# Patient Record
Sex: Female | Born: 1975 | Race: White | Hispanic: No | Marital: Single | Smoking: Former smoker
Health system: Southern US, Community
[De-identification: ages and names within clinical notes are randomized; demographics above are authoritative.]

## PROBLEM LIST (undated history)

## (undated) DIAGNOSIS — K900 Celiac disease: Secondary | ICD-10-CM

## (undated) DIAGNOSIS — D509 Iron deficiency anemia, unspecified: Secondary | ICD-10-CM

## (undated) DIAGNOSIS — K219 Gastro-esophageal reflux disease without esophagitis: Secondary | ICD-10-CM

## (undated) DIAGNOSIS — O26899 Other specified pregnancy related conditions, unspecified trimester: Secondary | ICD-10-CM

## (undated) DIAGNOSIS — F32A Depression, unspecified: Secondary | ICD-10-CM

## (undated) DIAGNOSIS — F909 Attention-deficit hyperactivity disorder, unspecified type: Secondary | ICD-10-CM

## (undated) DIAGNOSIS — T8859XA Other complications of anesthesia, initial encounter: Secondary | ICD-10-CM

## (undated) DIAGNOSIS — O4700 False labor before 37 completed weeks of gestation, unspecified trimester: Secondary | ICD-10-CM

## (undated) DIAGNOSIS — R51 Headache: Secondary | ICD-10-CM

## (undated) DIAGNOSIS — K259 Gastric ulcer, unspecified as acute or chronic, without hemorrhage or perforation: Secondary | ICD-10-CM

## (undated) DIAGNOSIS — K589 Irritable bowel syndrome without diarrhea: Secondary | ICD-10-CM

## (undated) DIAGNOSIS — T4145XA Adverse effect of unspecified anesthetic, initial encounter: Secondary | ICD-10-CM

## (undated) DIAGNOSIS — F419 Anxiety disorder, unspecified: Secondary | ICD-10-CM

## (undated) DIAGNOSIS — E559 Vitamin D deficiency, unspecified: Secondary | ICD-10-CM

## (undated) DIAGNOSIS — O368190 Decreased fetal movements, unspecified trimester, not applicable or unspecified: Secondary | ICD-10-CM

## (undated) DIAGNOSIS — Z8619 Personal history of other infectious and parasitic diseases: Secondary | ICD-10-CM

## (undated) HISTORY — DX: Vitamin D deficiency, unspecified: E55.9

## (undated) HISTORY — DX: Headache: R51

## (undated) HISTORY — DX: Gastric ulcer, unspecified as acute or chronic, without hemorrhage or perforation: K25.9

## (undated) HISTORY — DX: Celiac disease: K90.0

## (undated) HISTORY — DX: Attention-deficit hyperactivity disorder, unspecified type: F90.9

## (undated) HISTORY — DX: Gastro-esophageal reflux disease without esophagitis: K21.9

## (undated) HISTORY — DX: Other complications of anesthesia, initial encounter: T88.59XA

## (undated) HISTORY — DX: Personal history of other infectious and parasitic diseases: Z86.19

## (undated) HISTORY — DX: Depression, unspecified: F32.A

## (undated) HISTORY — DX: Other specified pregnancy related conditions, unspecified trimester: O26.899

## (undated) HISTORY — DX: False labor before 37 completed weeks of gestation, unspecified trimester: O47.00

## (undated) HISTORY — DX: Anxiety disorder, unspecified: F41.9

## (undated) HISTORY — DX: Decreased fetal movements, unspecified trimester, not applicable or unspecified: O36.8190

## (undated) HISTORY — DX: Iron deficiency anemia, unspecified: D50.9

## (undated) HISTORY — DX: Irritable bowel syndrome, unspecified: K58.9

## (undated) HISTORY — DX: Adverse effect of unspecified anesthetic, initial encounter: T41.45XA

---

## 2004-01-29 ENCOUNTER — Encounter: Admission: RE | Admit: 2004-01-29 | Discharge: 2004-01-29 | Payer: Self-pay | Admitting: Obstetrics and Gynecology

## 2004-12-18 ENCOUNTER — Emergency Department (HOSPITAL_COMMUNITY): Admission: EM | Admit: 2004-12-18 | Discharge: 2004-12-18 | Payer: Self-pay | Admitting: Emergency Medicine

## 2005-08-07 ENCOUNTER — Ambulatory Visit (HOSPITAL_COMMUNITY): Admission: RE | Admit: 2005-08-07 | Discharge: 2005-08-07 | Payer: Self-pay | Admitting: Obstetrics and Gynecology

## 2005-08-07 ENCOUNTER — Encounter (INDEPENDENT_AMBULATORY_CARE_PROVIDER_SITE_OTHER): Payer: Self-pay | Admitting: *Deleted

## 2007-12-11 ENCOUNTER — Inpatient Hospital Stay (HOSPITAL_COMMUNITY): Admission: AD | Admit: 2007-12-11 | Discharge: 2007-12-11 | Payer: Self-pay | Admitting: Obstetrics and Gynecology

## 2007-12-18 ENCOUNTER — Inpatient Hospital Stay (HOSPITAL_COMMUNITY): Admission: AD | Admit: 2007-12-18 | Discharge: 2007-12-21 | Payer: Self-pay | Admitting: Obstetrics and Gynecology

## 2009-06-28 ENCOUNTER — Inpatient Hospital Stay (HOSPITAL_COMMUNITY): Admission: RE | Admit: 2009-06-28 | Discharge: 2009-06-30 | Payer: Self-pay | Admitting: Obstetrics and Gynecology

## 2010-02-17 HISTORY — PX: DILATION AND CURETTAGE OF UTERUS: SHX78

## 2010-03-30 ENCOUNTER — Inpatient Hospital Stay (HOSPITAL_COMMUNITY): Payer: PRIVATE HEALTH INSURANCE

## 2010-03-30 ENCOUNTER — Inpatient Hospital Stay (HOSPITAL_COMMUNITY)
Admission: AD | Admit: 2010-03-30 | Discharge: 2010-03-30 | Disposition: A | Discharge status: Home / Self Care | Payer: PRIVATE HEALTH INSURANCE | Source: Ambulatory Visit | Attending: Obstetrics | Admitting: Obstetrics

## 2010-03-30 DIAGNOSIS — O200 Threatened abortion: Secondary | ICD-10-CM | POA: Insufficient documentation

## 2010-03-30 LAB — HCG, QUANTITATIVE, PREGNANCY: hCG, Beta Chain, Quant, S: 3007 m[IU]/mL — ABNORMAL HIGH (ref <5)

## 2010-03-30 LAB — PROGESTERONE: Progesterone: 20.9 ng/mL

## 2010-04-01 LAB — RH IMMUNE GLOBULIN WORKUP (NOT WOMEN'S HOSP)
ABO/RH(D): A NEG
Antibody Screen: NEGATIVE
Unit division: 0

## 2010-04-15 ENCOUNTER — Other Ambulatory Visit: Payer: Self-pay | Admitting: Obstetrics and Gynecology

## 2010-04-15 ENCOUNTER — Ambulatory Visit (HOSPITAL_COMMUNITY)
Admission: RE | Admit: 2010-04-15 | Discharge: 2010-04-15 | Disposition: A | Discharge status: Home / Self Care | Payer: PRIVATE HEALTH INSURANCE | Source: Ambulatory Visit | Attending: Obstetrics and Gynecology | Admitting: Obstetrics and Gynecology

## 2010-04-15 DIAGNOSIS — O021 Missed abortion: Secondary | ICD-10-CM | POA: Insufficient documentation

## 2010-04-15 LAB — CBC
HCT: 35.9 % — ABNORMAL LOW (ref 36.0–46.0)
Hemoglobin: 12 g/dL (ref 12.0–15.0)
MCH: 32 pg (ref 26.0–34.0)
MCHC: 33.4 g/dL (ref 30.0–36.0)
MCV: 95.7 fL (ref 78.0–100.0)
Platelets: 256 10*3/uL (ref 150–400)
RBC: 3.75 MIL/uL — ABNORMAL LOW (ref 3.87–5.11)
RDW: 12.3 % (ref 11.5–15.5)
WBC: 7.5 10*3/uL (ref 4.0–10.5)

## 2010-04-15 LAB — ABO/RH: ABO/RH(D): A NEG

## 2010-04-18 DEATH — deceased

## 2010-04-20 NOTE — Op Note (Signed)
  NAME:  Michelle Bartlett, Michelle Bartlett               ACCOUNT NO.:  [id number]  MEDICAL RECORD NO.:  [id number]           PATIENT TYPE:  O  LOCATION:  [facility]                          FACILITY:  [facility]  PHYSICIAN:  Lovenia Kim, M.D.DATE OF BIRTH:  06-10-1975  DATE OF PROCEDURE:  04/15/2010 DATE OF DISCHARGE:                              OPERATIVE REPORT   PREOPERATIVE DIAGNOSIS:  An embryonic fetal demise at 19 weeks.  POSTOPERATIVE DIAGNOSIS:  An embryonic fetal demise at 40 weeks.  PROCEDURE:  Suction D and E.  SURGEON:  Lovenia Kim, MD  ASSISTANT:  None.  ANESTHESIA:  MAC and paracervical.  BLOOD LOSS:  Less than 50 mL.  COMPLICATIONS:  None.  DRAINS:  None.  COUNTS:  Correct.  DISPOSITION:  The patient to recovery in good condition.  SPECIMEN:  Products of conception to pathology.  BRIEF OPERATIVE NOTE:  After being apprised of the risks of anesthesia, infection, bleeding, injury to abdominal organs secondary to uterine perforation with possible need for repair, the patient was brought to the operating room where she was administered dosing of her IV sedation without difficulty, prepped and draped in usual sterile fashion, catheterized till the bladder was empty.  Exam under anesthesia revealed a 6-week retroflexed uterus and no adnexal masses were appreciated.  A dilute paracervical block was placed using the 20 mL of the dilute Marcaine solution and the cervix posterior lip was grasped using a single-tooth tenaculum, easily dilated up to a #21 Pratt dilator, 7-mm suction curette placed.  Visualization revealed products of conception, which were inspected upon aspiration.  Good hemostasis was noted.  Blunt curettage in a four-quadrant method revealed the cavity to be empty.  Repeat suction confirmed good hemostasis was noted. All instruments were removed.  The patient tolerated the procedure well, was awakened and transferred to recovery in good condition.  The products of  conception and specimen was sent to pathology for permanent section.     Lovenia Kim, M.D.     RJT/MEDQ  D:  04/15/2010  T:  04/10/2010  Job:  [id number]  Electronically Signed by Brien Few M.D. on 04/20/2010 11:14:29 AM

## 2010-05-07 LAB — CCBB MATERNAL DONOR DRAW

## 2010-05-07 LAB — CBC
HCT: 32.3 % — ABNORMAL LOW (ref 36.0–46.0)
HCT: 32.4 % — ABNORMAL LOW (ref 36.0–46.0)
Hemoglobin: 11.3 g/dL — ABNORMAL LOW (ref 12.0–15.0)
Hemoglobin: 11.3 g/dL — ABNORMAL LOW (ref 12.0–15.0)
MCHC: 34.8 g/dL (ref 30.0–36.0)
MCHC: 35 g/dL (ref 30.0–36.0)
MCV: 100.3 fL — ABNORMAL HIGH (ref 78.0–100.0)
MCV: 99.2 fL (ref 78.0–100.0)
Platelets: 256 10*3/uL (ref 150–400)
Platelets: 269 10*3/uL (ref 150–400)
RBC: 3.22 MIL/uL — ABNORMAL LOW (ref 3.87–5.11)
RBC: 3.27 MIL/uL — ABNORMAL LOW (ref 3.87–5.11)
RDW: 13.2 % (ref 11.5–15.5)
RDW: 13.5 % (ref 11.5–15.5)
WBC: 12.4 10*3/uL — ABNORMAL HIGH (ref 4.0–10.5)
WBC: 15.1 10*3/uL — ABNORMAL HIGH (ref 4.0–10.5)

## 2010-05-07 LAB — RPR: RPR Ser Ql: NONREACTIVE

## 2010-07-02 NOTE — Op Note (Signed)
NAME:  Bartlett, Michelle               ACCOUNT NO.:  [id number]   MEDICAL RECORD NO.:  [id number]          PATIENT TYPE:  INP   LOCATION:  9138                          FACILITY:  [facility]   PHYSICIAN:  Lovenia Kim, M.D.DATE OF BIRTH:  12-Feb-1976   DATE OF PROCEDURE:  DATE OF DISCHARGE:                               OPERATIVE REPORT   INDICATION FOR OPERATIVE DELIVERY:  Maternal exhaustion.   PREOPERATIVE DIAGNOSIS:  Forty weeks maternal exhaustion.   POSTOPERATIVE DIAGNOSIS:  Forty weeks maternal exhaustion.   PROCEDURE:  Outlet vacuum-assisted vaginal delivery with a kiwi cup, 3  pulls and 1 pop off.   SURGEON:  Lovenia Kim, MD   ANESTHESIA:  Epidural.   ESTIMATED BLOOD LOSS:  500 mL.   COMPLICATIONS:  None.   DRAINS:  None.   COUNTS:  Correct.   The patient is recovering in good condition.   BRIEF OPERATIVE NOTE:  After being apprised of risks and benefits of  vacuum-assistance includes small instance of cephalohematoma, scalp  laceration, and intracranial hemorrhage, a kiwi cup was placed.  Fetal  vertex OA, +3 and +4 station for 3 pulls 1 pop off, full-term living  female over central median episiotomy without extension.  Apgars 8 and 9.  Bulb suctioning done.  Placenta delivered spontaneously and intact three-  vessel cord.  Cervix without lacerations and repaired with a 0 Vicryl  and a 3-0 Vicryl Rapide.  Good hemostasis.  Rectum was intact.  Cervix  was intact.  No other perineal lacerations noted.  The patient tolerated  the procedure well and is recovering in good condition.      Lovenia Kim, M.D.  Electronically Signed     RJT/MEDQ  D:  12/19/2007  T:  12/19/2007  Job:  [id number]

## 2010-07-02 NOTE — H&P (Signed)
NAME:  Michelle Bartlett, Michelle Bartlett               ACCOUNT NO.:  [id number]   MEDICAL RECORD NO.:  [id number]          PATIENT TYPE:  INP   LOCATION:  9166                          FACILITY:  [facility]   PHYSICIAN:  Lovenia Kim, M.D.DATE OF BIRTH:  [birth year]   DATE OF ADMISSION:  12/18/2007  DATE OF DISCHARGE:                              HISTORY & PHYSICAL   CHIEF COMPLAINT:  Spontaneous rupture of membranes in early labor.   She is a 35 year old white female G3, P0 at [redacted] weeks gestation who  presents with spontaneous rupture of membranes tonight at 7:35 and  prodromal labor pattern.  Currently in early labor.  She is a nonsmoker,  nondrinker.  She denies domestic or physical violence.   ALLERGIES:  She has allergies to CODEINE.   SOCIAL HISTORY:  She has a history of prior tobacco use.   FAMILY HISTORY:  Hypertension, lung cancer, heart disease, diabetes,  kidney stones, prostate cancer, depression, breast cancer, and stroke.   PREGNANCY HISTORY:  Her previous pregnancy is remarkable for one prior  spontaneous abortion and one induced abortion.   PRENATAL COURSE:  Remarkable for excessive weight gain, but otherwise  normal.   PHYSICAL EXAMINATION:  GENERAL:  She is a well-developed, well-nourished  white female in no acute distress.  HEENT:  Normal.  LUNGS:  Clear.  HEART:  Regular rhythm.  ABDOMEN:  Soft, gravid, nontender.  Estimated fetal weight 7.5-8 pounds.  Cervix is per RN, 2-3, 90% vertex, -1.  EXTREMITIES:  No cords.  NEUROLOGICAL:  Nonfocal.  SKIN:  Intact.   IMPRESSION:  Term intrauterine pregnancy with spontaneous rupture of  membranes.   PLAN:  Admit Pitocin as needed, epidural as needed.  Anticipate attempts  at vaginal delivery.      Lovenia Kim, M.D.  Electronically Signed    RJT/MEDQ  D:  12/18/2007  T:  12/19/2007  Job:  [id number]

## 2010-07-02 NOTE — H&P (Signed)
NAME:  Michelle Bartlett, Michelle Bartlett               ACCOUNT NO.:  [id number]   MEDICAL RECORD NO.:  [id number]          PATIENT TYPE:  MAT   LOCATION:  [facility]                          FACILITY:  [facility]   PHYSICIAN:  Lovenia Kim, M.D.DATE OF BIRTH:  [birth year]   DATE OF ADMISSION:  12/11/2007  DATE OF DISCHARGE:  12/11/2007                              HISTORY & PHYSICAL   CHIEF COMPLAINT:  Labor swelling.   HISTORY OF PRESENT ILLNESS:  She is a 35 year old white female G1, P0,  31 weeks' gestation, who presents with swelling of the face, hands, and  arm, questionable contractions.   ALLERGIES:  She has no known drug allergies.   MEDICATIONS:  Prenatal vitamins.   SOCIAL HISTORY:  She is a nonsmoker and nondrinker.  Denies domestic  physical violence.   PERSONAL HISTORY:  She has a personal history of gastric ulcer,  hemorrhoids, and ADHD.   FAMILY HISTORY:  Hypertension, lung cancer, heart disease, kidney  stones, depression, and breast cancer.   PHYSICAL EXAMINATION:  GENERAL:  She is a well-developed, well-  nourished, white female in no acute distress.  HEENT:  Normal.  LUNGS:  Clear.  HEART:  Regular rate and rhythm.  ABDOMEN:  Soft, gravid, and nontender.  Cervix is closed, 50%, vertex, -  2.  EXTREMITIES:  There are no cords.  DTRs on the extremity exam 2+.  No  evidence of clonus and 1+ pretibial edema noted.  NEUROLOGIC:  Nonfocal.  SKIN:  Intact.   IMPRESSION:  A 39-week OB:  1. No evidence of preeclampsia.  2. No evidence of gestational hypertension.  3. Preterm labor with reactive NST rare contractions noted.   PLAN:  Discharge home.  Follow up in the office as scheduled.     Lovenia Kim, M.D.  Electronically Signed    RJT/MEDQ  D:  12/11/2007  T:  12/12/2007  Job:  [id number]

## 2010-07-05 NOTE — Op Note (Signed)
NAME:  Michelle Bartlett, Michelle Bartlett               ACCOUNT NO.:  [id number]   MEDICAL RECORD NO.:  [id number]          PATIENT TYPE:  AMB   LOCATION:  [facility]                           FACILITY:  [facility]   PHYSICIAN:  Diona Foley, M.D.   DATE OF BIRTH:  [birth year]   DATE OF PROCEDURE:  08/07/2005  DATE OF DISCHARGE:                                 OPERATIVE REPORT   PREOPERATIVE DIAGNOSIS:  First trimester missed abortion.   POSTOPERATIVE DIAGNOSIS:  First trimester missed abortion.   PROCEDURE:  Dilation evacuation of the uterus.   SURGEON:  Diona Foley, M.D.   ANESTHESIA:  Conscious sedation with paracervical block.   ESTIMATED BLOOD LOSS:  Minimal.   COMPLICATIONS:  None.   FINDINGS:  Moderate amount of products of conception sent to pathology.   INDICATIONS:  This is a 35 year old gravida 2, para 0-0-1-0 white female who  had an episode of first trimester bleeding.  She presented for ultrasound  and was found to have a nonviable intrauterine pregnancy with a fetal pole  present but no cardiac activity.  The patient did receive RhoGAM as she is  Rh negative at the time of bleeding.  She presents today for surgical  management with D&E.  Prior to the procedure, the risks, benefits and  alternatives of procedure have been reviewed with the patient in detail.  We  discussed the risks which include but are not limited to hemorrhage  requiring transfusion, infection, injury to the uterus such as uterine  perforation which could cause injury to the bowel or bladder or other intra-  abdominal organs and could require additional surgery through the abdomen.  Also discussed risk of anesthesia and risk of Asherman syndrome which may  affect her fertility in the future.  I reviewed the possibility of retained  products of conception which would require anadditional procedure.  Prior to  the proceed, the patient voiced understanding of all the above risks and  desired to proceed. Informed consent was  obtained before proceeding to the  OR.   PROCEDURE:  The patient was taken to the operating room where she was given  conscious sedation.  She was then prepped and draped in the usual sterile  fashion with Betadine and placed in a dorsolithotomy position.  A red rubber  catheter was used to drain the bladder.  Bimanual exam was performed.  The  uterus was anteverted approximately 7 to 8 weeks size, mobile, with no  obvious masses and no adnexal masses palpated.  A speculum was then placed  into the vagina, and the cervix was grasped with a single-tooth tenaculum.  A paracervical block was administered using a total of 20 mL of 1%  Nesacaine.  After this, the uterus was then very gently dilated with Hegar  dilators.  Uterus sounded to 9 cm.  The #7 suction curette was then  introduced, and suction was applied with removal of a moderate amount of  products of conception.  Once suction was complete, this was followed by  sharp curettage until a gritty texture was noted in all four quadrants.  Suction was  performed one last time to remove any remaining fragments.  Once  the uterus was completely evacuated, the procedure was then terminated.  The  specimen was sent to pathology.  The single-toothed tenaculum was removed.  The tenaculum site was cauterized with silver nitrate.  The  speculum was then removed.  The uterus was examined and was small,  anteverted and mobile.  The patient was then taken out of dorsal lithotomy  position, was reversed from her anesthesia and was taken to recovery room  awake in stable condition.  All sponge, lap, needle and instrument counts  were correct x2.  There were no complications.      Diona Foley, M.D.  Electronically Signed     JW/MEDQ  D:  08/07/2005  T:  08/07/2005  Job:  [id number]

## 2010-11-18 LAB — CBC
HCT: 33.9 — ABNORMAL LOW
Hemoglobin: 11.5 — ABNORMAL LOW
MCHC: 33.8
MCV: 99.8
Platelets: 222
RBC: 3.4 — ABNORMAL LOW
RDW: 13.1
WBC: 11 — ABNORMAL HIGH

## 2010-11-18 LAB — RPR: RPR Ser Ql: NONREACTIVE

## 2010-11-19 LAB — CBC
HCT: 28.7 — ABNORMAL LOW
Hemoglobin: 9.8 — ABNORMAL LOW
MCHC: 34
MCV: 100.8 — ABNORMAL HIGH
Platelets: 205
RBC: 2.84 — ABNORMAL LOW
RDW: 13.5
WBC: 12.9 — ABNORMAL HIGH

## 2011-02-18 NOTE — L&D Delivery Note (Signed)
Delivery Note At 9:43 PM a viable and healthy female was delivered via Vaginal, Spontaneous Delivery (Presentation: ;  ).  APGAR: 7, 8; weight .   Placenta status: Intact, Spontaneous.  Cord: 3 vessels with the following complications: None.  Cord pH: na  Anesthesia: Epidural  Episiotomy: None Lacerations: 2nd degree;Perineal Suture Repair: 2.0 vicryl rapide Est. Blood Loss (mL): 300  Mom to postpartum.  Baby to nursery-stable.  Manuelita Moxon J 02/04/2012, 9:56 PM

## 2011-07-18 LAB — OB RESULTS CONSOLE GC/CHLAMYDIA
Chlamydia: NEGATIVE
Gonorrhea: NEGATIVE

## 2011-07-23 LAB — OB RESULTS CONSOLE RPR: RPR: NONREACTIVE

## 2011-07-23 LAB — OB RESULTS CONSOLE ABO/RH: RH Type: NEGATIVE

## 2011-07-23 LAB — OB RESULTS CONSOLE RUBELLA ANTIBODY, IGM: Rubella: IMMUNE

## 2011-07-23 LAB — OB RESULTS CONSOLE HEPATITIS B SURFACE ANTIGEN: Hepatitis B Surface Ag: NEGATIVE

## 2012-01-05 LAB — OB RESULTS CONSOLE GBS: GBS: NEGATIVE

## 2012-02-02 ENCOUNTER — Encounter (HOSPITAL_COMMUNITY): Payer: Self-pay | Admitting: *Deleted

## 2012-02-02 ENCOUNTER — Telehealth (HOSPITAL_COMMUNITY): Payer: Self-pay | Admitting: *Deleted

## 2012-02-02 NOTE — Telephone Encounter (Signed)
Preadmission screen  

## 2012-02-03 ENCOUNTER — Other Ambulatory Visit: Payer: Self-pay | Admitting: Obstetrics and Gynecology

## 2012-02-04 ENCOUNTER — Encounter (HOSPITAL_COMMUNITY): Payer: Self-pay

## 2012-02-04 ENCOUNTER — Encounter (HOSPITAL_COMMUNITY): Payer: Self-pay | Admitting: Anesthesiology

## 2012-02-04 ENCOUNTER — Inpatient Hospital Stay (HOSPITAL_COMMUNITY)
Admission: RE | Admit: 2012-02-04 | Discharge: 2012-02-06 | DRG: 373 | Disposition: A | Discharge status: Home / Self Care | Payer: BC Managed Care – PPO | Source: Ambulatory Visit | Attending: Obstetrics and Gynecology | Admitting: Obstetrics and Gynecology

## 2012-02-04 ENCOUNTER — Inpatient Hospital Stay (HOSPITAL_COMMUNITY): Payer: BC Managed Care – PPO | Admitting: Anesthesiology

## 2012-02-04 DIAGNOSIS — O368190 Decreased fetal movements, unspecified trimester, not applicable or unspecified: Secondary | ICD-10-CM | POA: Diagnosis present

## 2012-02-04 DIAGNOSIS — O878 Other venous complications in the puerperium: Secondary | ICD-10-CM | POA: Diagnosis present

## 2012-02-04 DIAGNOSIS — O09529 Supervision of elderly multigravida, unspecified trimester: Secondary | ICD-10-CM | POA: Diagnosis present

## 2012-02-04 DIAGNOSIS — D649 Anemia, unspecified: Secondary | ICD-10-CM | POA: Diagnosis not present

## 2012-02-04 DIAGNOSIS — IMO0002 Reserved for concepts with insufficient information to code with codable children: Secondary | ICD-10-CM | POA: Diagnosis present

## 2012-02-04 DIAGNOSIS — O9903 Anemia complicating the puerperium: Secondary | ICD-10-CM | POA: Diagnosis not present

## 2012-02-04 DIAGNOSIS — K649 Unspecified hemorrhoids: Secondary | ICD-10-CM | POA: Diagnosis present

## 2012-02-04 LAB — CBC
Hemoglobin: 10.7 g/dL — ABNORMAL LOW (ref 12.0–15.0)
RBC: 3.23 MIL/uL — ABNORMAL LOW (ref 3.87–5.11)
WBC: 10.6 10*3/uL — ABNORMAL HIGH (ref 4.0–10.5)

## 2012-02-04 LAB — RPR: RPR Ser Ql: NONREACTIVE

## 2012-02-04 MED ORDER — ZOLPIDEM TARTRATE 5 MG PO TABS: 5.0000 mg | ORAL_TABLET | Freq: Every evening | ORAL | Status: DC | PRN

## 2012-02-04 MED ORDER — LACTATED RINGERS IV SOLN
INTRAVENOUS | Status: DC
  Administered 2012-02-04 (×2): 125 mL/h via INTRAVENOUS

## 2012-02-04 MED ORDER — DIPHENHYDRAMINE HCL 25 MG PO CAPS: 25.0000 mg | ORAL_CAPSULE | Freq: Four times a day (QID) | ORAL | Status: DC | PRN

## 2012-02-04 MED ORDER — EPHEDRINE 5 MG/ML INJ: 10.0000 mg | INTRAVENOUS | Status: DC | PRN

## 2012-02-04 MED ORDER — DIBUCAINE 1 % RE OINT
1.0000 "application " | TOPICAL_OINTMENT | RECTAL | Status: DC | PRN
  Administered 2012-02-05 – 2012-02-06 (×2): 1 via RECTAL
  Filled 2012-02-04 (×2): qty 28

## 2012-02-04 MED ORDER — METHYLERGONOVINE MALEATE 0.2 MG/ML IJ SOLN: 0.2000 mg | INTRAMUSCULAR | Status: DC | PRN

## 2012-02-04 MED ORDER — CITRIC ACID-SODIUM CITRATE 334-500 MG/5ML PO SOLN
30.0000 mL | ORAL | Status: DC | PRN
  Administered 2012-02-04: 30 mL via ORAL
  Filled 2012-02-04: qty 15

## 2012-02-04 MED ORDER — ONDANSETRON HCL 4 MG/2ML IJ SOLN: 4.0000 mg | INTRAMUSCULAR | Status: DC | PRN

## 2012-02-04 MED ORDER — LANOLIN HYDROUS EX OINT: TOPICAL_OINTMENT | CUTANEOUS | Status: DC | PRN

## 2012-02-04 MED ORDER — ONDANSETRON HCL 4 MG PO TABS: 4.0000 mg | ORAL_TABLET | ORAL | Status: DC | PRN

## 2012-02-04 MED ORDER — LIDOCAINE HCL (PF) 1 % IJ SOLN
30.0000 mL | INTRAMUSCULAR | Status: DC | PRN
  Filled 2012-02-04: qty 30

## 2012-02-04 MED ORDER — LACTATED RINGERS IV SOLN: 500.0000 mL | INTRAVENOUS | Status: DC | PRN

## 2012-02-04 MED ORDER — TERBUTALINE SULFATE 1 MG/ML IJ SOLN: 0.2500 mg | Freq: Once | INTRAMUSCULAR | Status: DC | PRN

## 2012-02-04 MED ORDER — SENNOSIDES-DOCUSATE SODIUM 8.6-50 MG PO TABS
2.0000 | ORAL_TABLET | Freq: Every day | ORAL | Status: DC
  Administered 2012-02-05 (×2): 2 via ORAL

## 2012-02-04 MED ORDER — DIPHENHYDRAMINE HCL 50 MG/ML IJ SOLN: 12.5000 mg | INTRAMUSCULAR | Status: DC | PRN

## 2012-02-04 MED ORDER — PHENYLEPHRINE 40 MCG/ML (10ML) SYRINGE FOR IV PUSH (FOR BLOOD PRESSURE SUPPORT)
80.0000 ug | PREFILLED_SYRINGE | INTRAVENOUS | Status: DC | PRN
  Filled 2012-02-04: qty 5

## 2012-02-04 MED ORDER — FENTANYL 2.5 MCG/ML BUPIVACAINE 1/10 % EPIDURAL INFUSION (WH - ANES)
INTRAMUSCULAR | Status: DC | PRN
  Administered 2012-02-04: 14 mL/h via EPIDURAL

## 2012-02-04 MED ORDER — OXYTOCIN 40 UNITS IN LACTATED RINGERS INFUSION - SIMPLE MED
1.0000 m[IU]/min | INTRAVENOUS | Status: DC
  Administered 2012-02-04: 2 m[IU]/min via INTRAVENOUS
  Administered 2012-02-04: 12 m[IU]/min via INTRAVENOUS
  Filled 2012-02-04: qty 1000

## 2012-02-04 MED ORDER — PRENATAL MULTIVITAMIN CH
1.0000 | ORAL_TABLET | Freq: Every day | ORAL | Status: DC
  Administered 2012-02-05 – 2012-02-06 (×2): 1 via ORAL
  Filled 2012-02-04 (×2): qty 1

## 2012-02-04 MED ORDER — IBUPROFEN 600 MG PO TABS: 600.0000 mg | ORAL_TABLET | Freq: Four times a day (QID) | ORAL | Status: DC | PRN

## 2012-02-04 MED ORDER — FENTANYL 2.5 MCG/ML BUPIVACAINE 1/10 % EPIDURAL INFUSION (WH - ANES)
14.0000 mL/h | INTRAMUSCULAR | Status: DC
  Administered 2012-02-04: 14 mL/h via EPIDURAL
  Filled 2012-02-04 (×2): qty 125

## 2012-02-04 MED ORDER — OXYCODONE-ACETAMINOPHEN 5-325 MG PO TABS
1.0000 | ORAL_TABLET | ORAL | Status: DC | PRN
  Administered 2012-02-05 (×3): 1 via ORAL
  Administered 2012-02-05 (×2): 2 via ORAL
  Administered 2012-02-05: 1 via ORAL
  Administered 2012-02-05: 2 via ORAL
  Administered 2012-02-06 (×2): 1 via ORAL
  Administered 2012-02-06: 2 via ORAL
  Filled 2012-02-04 (×6): qty 1
  Filled 2012-02-04 (×2): qty 2
  Filled 2012-02-04: qty 1
  Filled 2012-02-04: qty 2
  Filled 2012-02-04: qty 1

## 2012-02-04 MED ORDER — FLEET ENEMA 7-19 GM/118ML RE ENEM: 1.0000 | ENEMA | RECTAL | Status: DC | PRN

## 2012-02-04 MED ORDER — TETANUS-DIPHTH-ACELL PERTUSSIS 5-2.5-18.5 LF-MCG/0.5 IM SUSP
0.5000 mL | Freq: Once | INTRAMUSCULAR | Status: AC
  Administered 2012-02-05: 0.5 mL via INTRAMUSCULAR
  Filled 2012-02-04: qty 0.5

## 2012-02-04 MED ORDER — PHENYLEPHRINE 40 MCG/ML (10ML) SYRINGE FOR IV PUSH (FOR BLOOD PRESSURE SUPPORT): 80.0000 ug | PREFILLED_SYRINGE | INTRAVENOUS | Status: DC | PRN

## 2012-02-04 MED ORDER — LACTATED RINGERS IV SOLN: 500.0000 mL | Freq: Once | INTRAVENOUS | Status: DC

## 2012-02-04 MED ORDER — OXYCODONE-ACETAMINOPHEN 5-325 MG PO TABS: 1.0000 | ORAL_TABLET | ORAL | Status: DC | PRN

## 2012-02-04 MED ORDER — LIDOCAINE HCL (PF) 1 % IJ SOLN
INTRAMUSCULAR | Status: DC | PRN
  Administered 2012-02-04 (×2): 4 mL

## 2012-02-04 MED ORDER — IBUPROFEN 600 MG PO TABS
600.0000 mg | ORAL_TABLET | Freq: Four times a day (QID) | ORAL | Status: DC
  Administered 2012-02-04 – 2012-02-06 (×6): 600 mg via ORAL
  Filled 2012-02-04 (×6): qty 1

## 2012-02-04 MED ORDER — METHYLERGONOVINE MALEATE 0.2 MG PO TABS: 0.2000 mg | ORAL_TABLET | ORAL | Status: DC | PRN

## 2012-02-04 MED ORDER — EPHEDRINE 5 MG/ML INJ
10.0000 mg | INTRAVENOUS | Status: DC | PRN
  Filled 2012-02-04: qty 4

## 2012-02-04 MED ORDER — OXYTOCIN 40 UNITS IN LACTATED RINGERS INFUSION - SIMPLE MED: 62.5000 mL/h | INTRAVENOUS | Status: DC

## 2012-02-04 MED ORDER — ONDANSETRON HCL 4 MG/2ML IJ SOLN
4.0000 mg | Freq: Four times a day (QID) | INTRAMUSCULAR | Status: DC | PRN
  Administered 2012-02-04 (×2): 4 mg via INTRAVENOUS
  Filled 2012-02-04 (×2): qty 2

## 2012-02-04 MED ORDER — BENZOCAINE-MENTHOL 20-0.5 % EX AERO
1.0000 "application " | INHALATION_SPRAY | CUTANEOUS | Status: DC | PRN
  Administered 2012-02-05 – 2012-02-06 (×3): 1 via TOPICAL
  Filled 2012-02-04 (×3): qty 56

## 2012-02-04 MED ORDER — WITCH HAZEL-GLYCERIN EX PADS
1.0000 "application " | MEDICATED_PAD | CUTANEOUS | Status: DC | PRN
  Administered 2012-02-05 – 2012-02-06 (×2): 1 via TOPICAL

## 2012-02-04 MED ORDER — ACETAMINOPHEN 325 MG PO TABS: 650.0000 mg | ORAL_TABLET | ORAL | Status: DC | PRN

## 2012-02-04 MED ORDER — SIMETHICONE 80 MG PO CHEW: 80.0000 mg | CHEWABLE_TABLET | ORAL | Status: DC | PRN

## 2012-02-04 MED ORDER — OXYTOCIN BOLUS FROM INFUSION
500.0000 mL | INTRAVENOUS | Status: DC
  Administered 2012-02-04: 500 mL via INTRAVENOUS

## 2012-02-04 NOTE — Progress Notes (Signed)
Comfortable, remains on pitocin as per orders Dr Ronita Hipps. Request to check on Fetal presentation as had EV this am.  O VSS Filed Vitals:   02/04/12 1101  BP: 120/63  Pulse: 73  Temp:          fhts category 1, baseline 140 - 145 bpm       abd soft between uc      Contractions: 1: 4 mins, mild       Vag: 1 -2 cms external os and internal os 1 cm (funnel), medium, posterior, Vx, Intact.      Bedside US: Presentation, Vx oblique RLQ. A Remains Vx and continues on Pitocin P Contacted Dr. Ronita Hipps for update     Plan: Continue Pitocin to provide contractions and in time, AROM as indicated.             Position: High Fowlers, Lt tilt to aid position of fetus for descent.  Wyatt Mage, CNM.

## 2012-02-04 NOTE — Progress Notes (Signed)
Dr Ronita Hipps in; baby in breech position, Dr turned baby to vertex(pitocin paused during procedure)

## 2012-02-04 NOTE — Progress Notes (Signed)
Michelle Bartlett is a 36 y.o. [id number] at 32w2dby LMP admitted for induction of labor due to dec FM.  Subjective:   Objective: BP 121/71  Pulse 70  Temp 98 F (36.7 C) (Oral)  Ht 5' 9"  (1.753 m)  Wt 87.544 kg (193 lb)  BMI 28.50 kg/m2  LMP 05/05/2011  Breastfeeding? Unknown      FHT:  FHR: 155 bpm, variability: moderate,  accelerations:  Present,  decelerations:  Absent UC:   irregular, every 5 minutes SVE:   Dilation: 3 Effacement (%): 60 Station: -2 Exam by:: Dr TRonita HippsAROM- clear  Labs: Lab Results  Component Value Date   WBC 10.6* 02/04/2012   HGB 10.7* 02/04/2012   HCT 31.1* 02/04/2012   MCV 96.3 02/04/2012   PLT 239 02/04/2012    Assessment / Plan: Induction of labor due to dec FM,  progressing well on pitocin  Labor: Progressing normally Preeclampsia:  na Fetal Wellbeing:  Category I Pain Control:  Labor support without medications I/D:  n/a Anticipated MOD:  NSVD  Michelle Bartlett 02/04/2012, 1:07 PM

## 2012-02-04 NOTE — Progress Notes (Signed)
Michelle Bartlett is a 36 y.o. [id number] at 19w2dby LMP admitted for induction of labor due to dec FM.  Subjective: Comfortable  Objective: BP 114/53  Pulse 66  Temp 97.6 F (36.4 C) (Oral)  Resp 18  Ht 5' 9"  (1.753 m)  Wt 87.544 kg (193 lb)  BMI 28.50 kg/m2  LMP 05/05/2011  Breastfeeding? Unknown      FHT:  FHR: 155 bpm, variability: moderate,  accelerations:  Present,  decelerations:  Absent UC:   irregular, every 3-5 minutes SVE:   4-5/60/-2 IUPC Placed without difficulty  Labs: Lab Results  Component Value Date   WBC 10.6* 02/04/2012   HGB 10.7* 02/04/2012   HCT 31.1* 02/04/2012   MCV 96.3 02/04/2012   PLT 239 02/04/2012    Assessment / Plan: Induction of labor due to dec FM,  progressing well on pitocin  Labor: Progressing normally Preeclampsia:  na Fetal Wellbeing:  Category I Pain Control:  Epidural I/D:  n/a Anticipated MOD:  NSVD  Emiko Osorto J 02/04/2012, 5:44 PM

## 2012-02-04 NOTE — Anesthesia Preprocedure Evaluation (Signed)
Anesthesia Evaluation  Patient identified by MRN, date of birth, ID band Patient awake    Reviewed: Allergy & Precautions, H&P , Patient's Chart, lab work & pertinent test results  Airway Mallampati: III TM Distance: >3 FB Neck ROM: full    Dental No notable dental hx. (+) Teeth Intact   Pulmonary neg pulmonary ROS,  breath sounds clear to auscultation  Pulmonary exam normal       Cardiovascular negative cardio ROS  Rhythm:regular Rate:Normal     Neuro/Psych  Headaches, PSYCHIATRIC DISORDERS ADHDnegative neurological ROS     GI/Hepatic negative GI ROS, Neg liver ROS, PUD, GERD-  Medicated and Controlled,  Endo/Other  negative endocrine ROS  Renal/GU negative Renal ROS  negative genitourinary   Musculoskeletal   Abdominal Normal abdominal exam  (+)   Peds  Hematology negative hematology ROS (+)   Anesthesia Other Findings   Reproductive/Obstetrics (+) Pregnancy                           Anesthesia Physical Anesthesia Plan  ASA: II  Anesthesia Plan: Epidural   Post-op Pain Management:    Induction:   Airway Management Planned: Natural Airway  Additional Equipment:   Intra-op Plan:   Post-operative Plan:   Informed Consent: I have reviewed the patients History and Physical, chart, labs and discussed the procedure including the risks, benefits and alternatives for the proposed anesthesia with the patient or authorized representative who has indicated his/her understanding and acceptance.   Dental advisory given  Plan Discussed with: Anesthesiologist  Anesthesia Plan Comments:         Anesthesia Quick Evaluation

## 2012-02-04 NOTE — Progress Notes (Signed)
Michelle Bartlett is a 36 y.o. [id number] at 63w2dby LMP admitted for induction for dec FM  Subjective: No complaints  Objective: BP 112/56  Pulse 67  Temp 98.2 F (36.8 C) (Axillary)  Ht 5' 9"  (1.753 m)  Wt 87.544 kg (193 lb)  BMI 28.50 kg/m2  LMP 05/05/2011  Breastfeeding? Unknown      FHT:  FHR: 155 bpm, variability: moderate,  accelerations:  Present,  decelerations:  Absent UC:   none SVE:   Dilation: 1 Effacement (%): 50 Station: -3 Exam by:: K.Forsell,RNC  Labs: Lab Results  Component Value Date   WBC 10.6* 02/04/2012   HGB 10.7* 02/04/2012   HCT 31.1* 02/04/2012   MCV 96.3 02/04/2012   PLT 239 02/04/2012    Assessment / Plan: Induction of labor due to dec FM,  progressing well on pitocin  Labor: Progressing on Pitocin, will continue to increase then AROM Preeclampsia:  na Fetal Wellbeing:  Category I Pain Control:  Labor support without medications I/D:  n/a Anticipated MOD:  NSVD  Bray Vickerman J 02/04/2012, 10:52 AM

## 2012-02-04 NOTE — Progress Notes (Signed)
Patient ID: Michelle Bartlett, female   DOB: 1975-06-08, 36 y.o.   MRN: [id number] Presented for induction. History of dec FM and oblique lie. Sono c/w Transverse to breech presentation.  Verbal consent obtained. ECV performed under sono guidance without complications. Pt tolerated procedure well. NsT reactive pre and post ECV.

## 2012-02-04 NOTE — Anesthesia Procedure Notes (Signed)
Epidural Patient location during procedure: OB Start time: 02/04/2012 1:56 PM  Staffing Anesthesiologist: Karrington Studnicka A. Performed by: anesthesiologist   Preanesthetic Checklist Completed: patient identified, site marked, surgical consent, pre-op evaluation, timeout performed, IV checked, risks and benefits discussed and monitors and equipment checked  Epidural Patient position: sitting Prep: site prepped and draped and DuraPrep Patient monitoring: continuous pulse ox and blood pressure Approach: midline Injection technique: LOR air  Needle:  Needle type: Tuohy  Needle gauge: 17 G Needle length: 9 cm and 9 Needle insertion depth: 4 cm Catheter type: closed end flexible Catheter size: 19 Gauge Catheter at skin depth: 9 cm Test dose: negative and Other  Assessment Events: blood not aspirated, injection not painful, no injection resistance, negative IV test and no paresthesia  Additional Notes Patient identified. Risks and benefits discussed including failed block, incomplete  Pain control, post dural puncture headache, nerve damage, paralysis, blood pressure Changes, nausea, vomiting, reactions to medications-both toxic and allergic and post Partum back pain. All questions were answered. Patient expressed understanding and wished to proceed. Sterile technique was used throughout procedure. Epidural site was Dressed with sterile barrier dressing. No paresthesias, signs of intravascular injection Or signs of intrathecal spread were encountered.  Patient was more comfortable after the epidural was dosed. Please see RN's note for documentation of vital signs and FHR which are stable.

## 2012-02-05 ENCOUNTER — Encounter (HOSPITAL_COMMUNITY): Payer: Self-pay

## 2012-02-05 LAB — CBC
Hemoglobin: 11.3 g/dL — ABNORMAL LOW (ref 12.0–15.0)
MCH: 33 pg (ref 26.0–34.0)
MCHC: 33.9 g/dL (ref 30.0–36.0)
MCV: 97.4 fL (ref 78.0–100.0)
RBC: 3.42 MIL/uL — ABNORMAL LOW (ref 3.87–5.11)

## 2012-02-05 MED ORDER — RHO D IMMUNE GLOBULIN 1500 UNIT/2ML IJ SOLN
300.0000 ug | Freq: Once | INTRAMUSCULAR | Status: AC
  Administered 2012-02-05: 300 ug via INTRAMUSCULAR
  Filled 2012-02-05: qty 2

## 2012-02-05 MED ORDER — DOCUSATE SODIUM 100 MG PO CAPS
100.0000 mg | ORAL_CAPSULE | Freq: Two times a day (BID) | ORAL | Status: DC
  Administered 2012-02-05 – 2012-02-06 (×3): 100 mg via ORAL
  Filled 2012-02-05 (×3): qty 1

## 2012-02-05 MED ORDER — HYDROCORTISONE ACE-PRAMOXINE 1-1 % RE FOAM
1.0000 | Freq: Three times a day (TID) | RECTAL | Status: DC | PRN
  Administered 2012-02-05: 1 via RECTAL
  Filled 2012-02-05: qty 10

## 2012-02-05 MED ORDER — CALCIUM CARBONATE ANTACID 500 MG PO CHEW
2.0000 | CHEWABLE_TABLET | Freq: Two times a day (BID) | ORAL | Status: DC | PRN
  Administered 2012-02-05: 400 mg via ORAL
  Filled 2012-02-05: qty 2

## 2012-02-05 MED ORDER — PRAMOXINE HCL 1 % RE FOAM
Freq: Three times a day (TID) | RECTAL | Status: DC | PRN
  Filled 2012-02-05: qty 15

## 2012-02-05 MED ORDER — SCOPOLAMINE 1 MG/3DAYS TD PT72
MEDICATED_PATCH | TRANSDERMAL | Status: AC
  Filled 2012-02-05: qty 1

## 2012-02-05 NOTE — Progress Notes (Signed)
Patient ID: Michelle Bartlett, female   DOB: 1975/07/24, 36 y.o.   MRN: [id number] PPD # 1  Subjective: Pt reports feeling tired and sore/ Pain controlled with ibuprofen and percocet Hx hemorrhoids and C/O significant hemorrhoid pain Tolerating po/ Voiding without problems/ No n/v Bleeding is moderate Newborn info:  Information for the patient's newborn:  [name], [name] [[id number]]  female Feeding: breast   Objective:  VS: Blood pressure 112/77, pulse 74, temperature 98.2 F (36.8 C), temperature source Oral, resp. rate 16    Basename 02/05/12 0535 02/04/12 0745  WBC 13.9* 10.6*  HGB 11.3* 10.7*  HCT 33.3* 31.1*  PLT 237 239    Blood type: --/--/A NEG (12/18 0745) Rubella: Immune (06/05 0000)    Physical Exam:  General: A & O x 3  alert, cooperative and no distress CV: Regular rate and rhythm Resp: clear Abdomen: soft, nontender, normal bowel sounds Uterine Fundus: firm, below umbilicus, nontender Perineum: mild edema; well approximated Lochia: moderate Rectum: 3 small fleshy pink hemorrhoids noted; not thrombosed Ext: edema trace   A/P: PPD # 1/ [id number]/ S/P:spontaneous vaginal delivery with 2nd deg repair Hemorrhoids; meds as ordered Doing well Continue routine post partum orders Anticipate D/C home in AM    Darleen Crocker, MSN, [facility] 02/05/2012, 8:34 AM

## 2012-02-05 NOTE — Anesthesia Postprocedure Evaluation (Signed)
  Anesthesia Post-op Note  Patient: Michelle Bartlett  Procedure(s) Performed: * No procedures listed *   Anesthesia Post-op Note  Patient: Michelle Bartlett  Procedure(s) Performed: * No procedures listed *  Patient Location: PACU and Mother/Baby  Anesthesia Type:Epidural  Level of Consciousness: awake, alert  and oriented  Airway and Oxygen Therapy: Patient Spontanous Breathing  Post-op Pain: none  Post-op Assessment: Post-op Vital signs reviewed, Patient's Cardiovascular Status Stable, No headache, No backache, No residual numbness and No residual motor weakness  Post-op Vital Signs: Reviewed and stable  Complications: No apparent anesthesia complications  Anesthesia Post-op Note  Patient: Michelle Bartlett  Procedure(s) Performed: * No procedures listed *  Patient Location: PACU and Mother/Baby  Anesthesia Type:Epidural  Level of Consciousness: awake, alert  and oriented  Airway and Oxygen Therapy: Patient Spontanous Breathing  Post-op Pain: mild  Post-op Assessment: Post-op Vital signs reviewed, Patient's Cardiovascular Status Stable, No headache, No backache, No residual numbness and No residual motor weakness  Post-op Vital Signs: Reviewed and stable  Complications: No apparent anesthesia complications

## 2012-02-06 LAB — TYPE AND SCREEN
ABO/RH(D): A NEG
Unit division: 0
Unit division: 0

## 2012-02-06 LAB — RH IG WORKUP (INCLUDES ABO/RH)
ABO/RH(D): A NEG
Fetal Screen: NEGATIVE
Gestational Age(Wks): 39.2
Unit division: 0

## 2012-02-06 MED ORDER — IBUPROFEN 600 MG PO TABS: 600.0000 mg | ORAL_TABLET | Freq: Four times a day (QID) | ORAL | Status: DC | PRN

## 2012-02-06 MED ORDER — OXYCODONE-ACETAMINOPHEN 5-325 MG PO TABS: 1.0000 | ORAL_TABLET | ORAL | Status: DC | PRN

## 2012-02-06 MED ORDER — SIMETHICONE 80 MG PO CHEW: 80.0000 mg | CHEWABLE_TABLET | ORAL | Status: DC | PRN

## 2012-02-06 MED ORDER — DSS 100 MG PO CAPS: 100.0000 mg | ORAL_CAPSULE | Freq: Two times a day (BID) | ORAL | Status: DC

## 2012-02-06 MED ORDER — HYDROCORTISONE ACE-PRAMOXINE 1-1 % RE FOAM: 1.0000 | Freq: Three times a day (TID) | RECTAL | Status: DC | PRN

## 2012-02-06 NOTE — Progress Notes (Signed)
Post Partum Day 2 NSVD without complications viable female infant. Subjective: no complaints, up ad lib without syncope, voiding, tolerating PO, + flatus, +BM  Pain well controlled with po meds, taking motrin and percocet  BF: on demand Mood stable, bonding well    Objective: Blood pressure 116/71, pulse 73, temperature 97.3 F (36.3 C), temperature source Oral, resp. rate 18, height 5' 9"  (1.753 m), weight 87.544 kg (193 lb), last menstrual period 05/05/2011, unknown if currently breastfeeding.  Physical Exam:  General: alert, cooperative and no distress Breasts: N/T Lungs: CTAB Heart: RRR Lochia: appropriate Uterine Fundus: firm, -2/u. C/o cramping Perineum: healing well Hemorrhoids: Swollen and painful DVT Evaluation: No evidence of DVT seen on physical exam. Negative Homan's sign. No cords or calf tenderness. No significant calf/ankle edema.   Basename 02/05/12 0535 02/04/12 0745  HGB 11.3* 10.7*  HCT 33.3* 31.1*   Slight anemia post delivery. Advised liquid Iron Supplement Floridix x 2 weeks and Iron rich diet.  Assessment/Plan: Discharge home       LOS: 2 days   Jayde Daffin 02/06/2012, 9:51 AM      LOS: 2 days   Cesar Rogerson 02/06/2012, 9:51 AM

## 2012-02-06 NOTE — Discharge Summary (Signed)
Physician Discharge Summary  Patient ID: Michelle Bartlett MRN: [id number] DOB/AGE: 1975/10/29 36 y.o.  Admit date: 02/04/2012 Discharge date: 02/06/2012  Admission Diagnoses: Admitted for EV for breech Presentation. Post EV Cephalic presentation. IOL at [redacted]w[redacted]d  Discharge Diagnoses:  Principal Problem:  *Postpartum care following vaginal delivery F (02/04/12)   Discharged Condition: stable  Hospital Course: Admitted for EV from Breech to Cephalic Presentation . IOL with Pitocin and AROM. NSVD with 2nd degree perineal laceration. Hemorrhoids swollen pre natally and remains engorged and swollen post delivery.  Consults: None  Significant Diagnostic Studies: labs: prenatal  microbiology: GBS - neg and radiology: Ultrasound: anatomy normal  Treatments: IV hydration and analgesia: acetaminophen w/ codeine and ibuprofen  Discharge Exam: Blood pressure 116/71, pulse 73, temperature 97.3 F (36.3 C), temperature source Oral, resp. rate 18, height 5' 9"  (1.753 m), weight 87.544 kg (193 lb), last menstrual period 05/05/2011, unknown if currently breastfeeding.  ROS:  General appearance: alert, cooperative and no distress Affect: more relaxed today - had been very anxious during labor progress Lungs: CTAB Breasts: N/T CV: RRR Abdomen: Soft, N/T, B/s x 4 with BM GU: no problems voiding GI: normal Perineum: 2nd degree laceration, Hemorrhoids swollen and painful - using Proctofoam HC and Tucks pads. This is a pre existing problem form antenatal phase and will need further attention in postpartum phase for f/u plan. Lochia: Min, Rubra Extremities: no swelling, No edema bilaterally.  Disposition: 01-Home or Self Care  Discharge Orders    Future Orders Please Complete By Expires   Diet general      Discharge instructions      Comments:   Per [facility] Booklet       Medication List     As of 02/06/2012 10:19 AM    STOP taking these medications         acetaminophen 500 MG  tablet   Commonly known as: TYLENOL      TAKE these medications         DSS 100 MG Caps   Take 100 mg by mouth 2 (two) times daily.      hydrocortisone 25 MG suppository   Commonly known as: ANUSOL-HC   Place 25 mg rectally 2 (two) times daily.      hydrocortisone-pramoxine rectal foam   Commonly known as: PROCTOFOAM-HC   Place 1 applicator rectally 3 (three) times daily as needed.      hydrocortisone-pramoxine rectal foam   Commonly known as: PROCTOFOAM-HC   Place 1 applicator rectally 2 (two) times daily.      ibuprofen 600 MG tablet   Commonly known as: ADVIL,MOTRIN   Take 1 tablet (600 mg total) by mouth every 6 (six) hours as needed for pain.      oxyCODONE-acetaminophen 5-325 MG per tablet   Commonly known as: PERCOCET/ROXICET   Take 1-2 tablets by mouth every 4 (four) hours as needed (moderate - severe pain).      prenatal multivitamin Tabs   Take 1 tablet by mouth daily.      simethicone 80 MG chewable tablet   Commonly known as: MYLICON   Chew 1 tablet (80 mg total) by mouth as needed for flatulence.           Follow-up Information    Follow up with [facility]. In 6 days. (As needed)    Contact information:   [phone]        Signed: DWyatt Mage  CNM. 02/06/2012, 10:19 AM

## 2012-02-19 ENCOUNTER — Ambulatory Visit (HOSPITAL_COMMUNITY)
Admission: RE | Admit: 2012-02-19 | Discharge: 2012-02-19 | Disposition: A | Discharge status: Home / Self Care | Payer: BC Managed Care – PPO | Source: Ambulatory Visit | Attending: Obstetrics and Gynecology | Admitting: Obstetrics and Gynecology

## 2012-02-19 NOTE — Progress Notes (Signed)
Adult Lactation Consultation Outpatient Visit Note  Patient Name: Michelle Bartlett                                                                 GD;9-24 Date of Birth: 08-07-1975                        todays weight: 8-8.1 Gestational Age at Delivery: 83w2dType of Delivery: vaginal del,.  Breastfeeding History: Frequency of Breastfeeding: every 2-3 hours Length of Feeding: 15-20 mins each breast Voids: 10-12 Stools: 6 yellow seedy  Supplementing / Method: Pumping:  Type of Pump:Pump n style   Frequency: every 2-3 hours after feedings  Volume:  2-3 ounces  Comments:  Mothers nipples became extremely sore and cracked. Mother has been exclusively pumping since Monday until today. She has breast fed infant several times with painful latch. Infant has had a slow weight gain.  Nipples are cracked and scabbed. Nipple tissue very pink and bleeding after feeding.  Consultation Evaluation:Observed mother latching infant. Lots of teaching with proper technique to prevent soreness. Encouraged mother to use good breast support. Infant latched onto (L) breast  With good deep latch. Mother denies pain with latch. Observed good milk transfer of 46 ml.  Attempt to latch onto (R) nipple and mother unable to bear pain. Used a #24 nipple shield on (R) breast . Infant latched deep with good burst of suckling and swallows for 15 mins. Transferring 30 ml . Total feeding 76 ml. Lots of teaching on proper technique and tips to prevent future sore nipples.   Initial Feeding Assessment: Pre-feed WQASTMH:[id number]-feed WWLNLGX:2119Amount Transferred:462mComments:  Additional Feeding Assessment: Pre-feed WeERDEYC:1448ost-feed WeJEHUDJ:4970mount Transferred:3023momments:  Additional Feeding Assessment: Pre-feed Weight: Post-feed Weight: Amount Transferred: Comments:  Total Breast milk Transferred this Visit: 68m60mtal Supplement Given:   Additional Interventions: Mother  phoned OB to get RX for all purpose nipple cream.  Mother to use good support and wait for wide gape and bring infant quickly to breast.  Mother to rotate positions and unlatch infant properly.  Mother inst to follow up with lactation services for weight check in one week. Mother states she will call for follow up visit.  Follow-Up  PRN    [name], 3:58 PM

## 2012-10-30 ENCOUNTER — Emergency Department (HOSPITAL_COMMUNITY)
Admission: EM | Admit: 2012-10-30 | Discharge: 2012-10-30 | Disposition: A | Discharge status: Home / Self Care | Payer: 59 | Source: Home / Self Care | Attending: Family Medicine | Admitting: Family Medicine

## 2012-10-30 ENCOUNTER — Emergency Department (INDEPENDENT_AMBULATORY_CARE_PROVIDER_SITE_OTHER): Payer: 59

## 2012-10-30 DIAGNOSIS — S93409A Sprain of unspecified ligament of unspecified ankle, initial encounter: Secondary | ICD-10-CM

## 2012-10-30 MED ORDER — TRAMADOL HCL 50 MG PO TABS: 50.0000 mg | ORAL_TABLET | Freq: Four times a day (QID) | ORAL | Status: DC | PRN

## 2012-10-30 NOTE — ED Notes (Signed)
Fell down 2 steps while holding her daughter @ 1700.  States she twisted her R ankle.

## 2012-10-30 NOTE — ED Provider Notes (Signed)
CSN: [id number]     Arrival date & time 10/30/12  1811 History   First MD Initiated Contact with Patient 10/30/12 1836     Chief Complaint  Patient presents with  . Ankle Pain   (Consider location/radiation/quality/duration/timing/severity/associated sxs/prior Treatment) HPI Comments: 37 year old female presents for evaluation of an ankle injury to her right ankle sustained at 5:00 this afternoon. She was taking a step when she fell down 2 steps holding her daughter on her hip. She twisted the right ankle-inversion injury. She had immediate pain and swelling in the lateral right ankle. She has been unable to bear weight on the ankle since this happened. The top of the ankle feels slightly numb as well. There is no other injury. No previous history of ankle sprains or fractures  Patient is a 38 y.o. female presenting with ankle pain.  Ankle Pain Associated symptoms: no fever     Past Medical History  Diagnosis Date  . H/O varicella   . Headache   . Decreased fetal movements, affecting management of mother, unspecified as to episode of care in pregnancy   . Other specified complication of pregnancy, unspecified as to episode of care     back pain  . Threatened premature labor, antepartum   . Attention deficit disorder with hyperactivity   . GERD (gastroesophageal reflux disease)   . Gastric ulcer, unspecified as acute or chronic, without mention of hemorrhage, perforation, or obstruction   . Complication of anesthesia     spinal headache with epidural even in labor with second child  . Postpartum care following vaginal delivery F (02/04/12) 02/05/2012   No past surgical history on file. Family History  Problem Relation Age of Onset  . Cancer Mother     breast  . Urolithiasis Father   . Hypertension Father   . Depression Maternal Grandmother   . Heart attack Maternal Grandfather   . Stroke Maternal Grandfather   . Cancer Maternal Grandfather     prostate  . Diabetes Maternal  Grandfather   . Cancer Paternal Grandmother     breast  . Hypertension Paternal Grandmother   . Heart attack Paternal Grandfather   . Hypertension Paternal Grandfather    History  Substance Use Topics  . Smoking status: Never Smoker   . Smokeless tobacco: Never Used  . Alcohol Use: No   OB History   Grav Para Term Preterm Abortions TAB SAB Ect Mult Living   7 3 3  3 1 2   3      Review of Systems  Constitutional: Negative for fever and chills.  Eyes: Negative for visual disturbance.  Respiratory: Negative for cough and shortness of breath.   Cardiovascular: Negative for chest pain, palpitations and leg swelling.  Gastrointestinal: Negative for nausea, vomiting and abdominal pain.  Endocrine: Negative for polydipsia and polyuria.  Genitourinary: Negative for dysuria, urgency and frequency.  Musculoskeletal: Positive for arthralgias and gait problem. Negative for myalgias.  Skin: Negative for rash.  Neurological: Negative for dizziness, weakness and light-headedness.    Allergies  Codeine  Home Medications   Current Outpatient Rx  Name  Route  Sig  Dispense  Refill  . docusate sodium 100 MG CAPS   Oral   Take 100 mg by mouth 2 (two) times daily.   10 capsule      . hydrocortisone (ANUSOL-HC) 25 MG suppository   Rectal   Place 25 mg rectally 2 (two) times daily.         . hydrocortisone-pramoxine (  PROCTOFOAM-HC) rectal foam   Rectal   Place 1 applicator rectally 2 (two) times daily.         . hydrocortisone-pramoxine (PROCTOFOAM-HC) rectal foam   Rectal   Place 1 applicator rectally 3 (three) times daily as needed.   10 g   2   . ibuprofen (ADVIL,MOTRIN) 600 MG tablet   Oral   Take 1 tablet (600 mg total) by mouth every 6 (six) hours as needed for pain.   30 tablet   2   . oxyCODONE-acetaminophen (PERCOCET/ROXICET) 5-325 MG per tablet   Oral   Take 1-2 tablets by mouth every 4 (four) hours as needed (moderate - severe pain).   30 tablet   0   .  Prenatal Vit-Fe Fumarate-FA (PRENATAL MULTIVITAMIN) TABS   Oral   Take 1 tablet by mouth daily.         . simethicone (MYLICON) 80 MG chewable tablet   Oral   Chew 1 tablet (80 mg total) by mouth as needed for flatulence.   30 tablet   2   . traMADol (ULTRAM) 50 MG tablet   Oral   Take 1 tablet (50 mg total) by mouth every 6 (six) hours as needed for pain.   20 tablet   0    LMP 10/05/2012 Physical Exam  Nursing note and vitals reviewed. Constitutional: She is oriented to person, place, and time. Vital signs are normal. She appears well-developed and well-nourished. No distress.  HENT:  Head: Normocephalic and atraumatic.  Pulmonary/Chest: Effort normal. No respiratory distress.  Musculoskeletal:       Right ankle: She exhibits decreased range of motion and swelling (lateral). She exhibits no ecchymosis and no deformity. Tenderness. AITFL tenderness found. No lateral malleolus, no medial malleolus, no CF ligament, no posterior TFL, no head of 5th metatarsal and no proximal fibula tenderness found. Achilles tendon normal.  Neurological: She is alert and oriented to person, place, and time. She has normal strength. Coordination normal.  Skin: Skin is warm and dry. No rash noted. She is not diaphoretic.  Psychiatric: She has a normal mood and affect. Judgment normal.    ED Course  Procedures (including critical care time) Labs Review Labs Reviewed - No data to display Imaging Review Dg Ankle Complete Right  10/30/2012   CLINICAL DATA:  Inversion injury.  EXAM: RIGHT ANKLE - COMPLETE 3+ VIEW  COMPARISON:  None.  FINDINGS: There is no evidence of fracture, dislocation, or joint effusion. There is no evidence of arthropathy or other focal bone abnormality. Soft tissues are unremarkable.  IMPRESSION: Negative.   Electronically Signed   By: Rolm Baptise M.D.   On: 10/30/2012 18:56   Dg Foot Complete Right  10/30/2012   CLINICAL DATA:  Inversion injury.  EXAM: RIGHT FOOT COMPLETE -  3+ VIEW  COMPARISON:  None.  FINDINGS: There is no evidence of fracture or dislocation. There is no evidence of arthropathy or other focal bone abnormality. Soft tissues are unremarkable.  IMPRESSION: Negative.   Electronically Signed   By: Rolm Baptise M.D.   On: 10/30/2012 18:56    MDM   1. Ankle sprain and strain, right, initial encounter    No radiographic evidence of fracture. The mechanism and exam are consistent with inversion ankle sprain. We'll place in an ASO brace,RICE crutches, follow up with sports medicine.   Meds ordered this encounter  Medications  . traMADol (ULTRAM) 50 MG tablet    Sig: Take 1 tablet (50 mg total) by  mouth every 6 (six) hours as needed for pain.    Dispense:  20 tablet    Refill:  0        Liam Graham, PA-C 10/30/12 1922

## 2012-10-31 NOTE — ED Provider Notes (Signed)
Medical screening examination/treatment/procedure(s) were performed by a resident physician or non-physician practitioner and as the supervising physician I was immediately available for consultation/collaboration.  Lynne Leader, MD    Gregor Hams, MD 10/31/12 [phone]

## 2012-12-26 ENCOUNTER — Ambulatory Visit (INDEPENDENT_AMBULATORY_CARE_PROVIDER_SITE_OTHER): Payer: 59 | Admitting: Physician Assistant

## 2012-12-26 ENCOUNTER — Encounter: Payer: Self-pay | Admitting: Physician Assistant

## 2012-12-26 VITALS — BP 110/76 | HR 64 | Temp 97.6°F | Resp 16 | Ht 67.5 in | Wt 152.0 lb

## 2012-12-26 DIAGNOSIS — R350 Frequency of micturition: Secondary | ICD-10-CM

## 2012-12-26 DIAGNOSIS — R300 Dysuria: Secondary | ICD-10-CM

## 2012-12-26 LAB — POCT UA - MICROSCOPIC ONLY
Casts, Ur, LPF, POC: NEGATIVE
Crystals, Ur, HPF, POC: NEGATIVE
Mucus, UA: NEGATIVE
Yeast, UA: NEGATIVE

## 2012-12-26 LAB — POCT URINALYSIS DIPSTICK
Blood, UA: NEGATIVE
Glucose, UA: NEGATIVE
Nitrite, UA: POSITIVE
Protein, UA: 100
Spec Grav, UA: <=1.005
Urobilinogen, UA: 4
pH, UA: 5

## 2012-12-26 MED ORDER — CIPROFLOXACIN HCL 500 MG PO TABS: 500.0000 mg | ORAL_TABLET | Freq: Two times a day (BID) | ORAL | Status: DC

## 2012-12-26 NOTE — Progress Notes (Signed)
Patient ID: Michelle Bartlett MRN: [id number], DOB: 10/01/1975, 37 y.o. Date of Encounter: 12/26/2012, 11:13 AM  Primary Physician: Lovenia Kim, MD  Chief Complaint: urinary frequency and dysuria  HPI: 37 y.o. year old female with presents with 3 day history of urinary frequency, dysuria, and suprapubic pressure. Complains of some pain radiating to her left flank and some chills today. Denies fever,  vaginal discharge, or odor. Has tried Azo which helps somewhat. LNMP 12/02/12. Husband recently had a vasectomy. She has 3 children at home.  Patient is otherwise doing well without issues or complaints.  Past Medical History  Diagnosis Date  . H/O varicella   . Headache(784.0)   . Decreased fetal movements, affecting management of mother, unspecified as to episode of care in pregnancy   . Other specified complication of pregnancy, unspecified as to episode of care     back pain  . Threatened premature labor, antepartum   . Attention deficit disorder with hyperactivity(314.01)   . GERD (gastroesophageal reflux disease)   . Gastric ulcer, unspecified as acute or chronic, without mention of hemorrhage, perforation, or obstruction   . Complication of anesthesia     spinal headache with epidural even in labor with second child  . Postpartum care following vaginal delivery F (02/04/12) 02/05/2012     Home Meds: Prior to Admission medications   Medication Sig Start Date End Date Taking? Authorizing Provider  acetaminophen (TYLENOL) 325 MG tablet Take 650 mg by mouth every 6 (six) hours as needed.   Yes Historical Provider, MD  phenazopyridine (PYRIDIUM) 95 MG tablet Take 95 mg by mouth 3 (three) times daily as needed for pain.   Yes Historical Provider, MD  ciprofloxacin (CIPRO) 500 MG tablet Take 1 tablet (500 mg total) by mouth 2 (two) times daily. 12/26/12   Collene Leyden, PA-C    Allergies:  Allergies  Allergen Reactions  . Codeine Nausea And Vomiting    History   Social  History  . Marital Status: Married    Spouse Name: N/A    Number of Children: N/A  . Years of Education: N/A   Occupational History  . Not on file.   Social History Main Topics  . Smoking status: Never Smoker   . Smokeless tobacco: Never Used  . Alcohol Use: No  . Drug Use: No  . Sexual Activity: Yes   Other Topics Concern  . Not on file   Social History Narrative  . No narrative on file     Review of Systems: Constitutional: negative for chills, fever, night sweats, weight changes, or fatigue  HEENT: negative for vision changes, hearing loss, congestion, rhinorrhea, ST, epistaxis, or sinus pressure Cardiovascular: negative for chest pain or palpitations Respiratory: negative for cough, hemoptysis, wheezing, shortness of breath. Abdominal: positive for suprapubic abdominal pain,   No nausea, vomiting, diarrhea, or constipation Genitourinary: positive for urinary frequency and dysuria. Negative for vaginal discharge, odor, or pelvic pain.  Dermatological: negative for rashes. Neurologic: negative for headache, dizziness, or syncope   Physical Exam: Blood pressure 110/76, pulse 64, temperature 97.6 F (36.4 C), temperature source Oral, resp. rate 16, height 5' 7.5" (1.715 m), weight 152 lb (68.947 kg), last menstrual period 12/02/2012, SpO2 98.00%, not currently breastfeeding., Body mass index is 23.44 kg/(m^2). General: Well developed, well nourished, in no acute distress. Head: Normocephalic, atraumatic, eyes without discharge, sclera non-icteric, nares are without discharge. External ear normal in appearance. Neck: Supple. No thyromegaly. Full ROM. No lymphadenopathy. Lungs: Clear bilaterally to  auscultation without wheezes, rales, or rhonchi. Breathing is unlabored. Heart: RRR with S1 S2. No murmurs, rubs, or gallops appreciated. Abdominal: +BS x 4. No hepatosplenomegaly, rebound tenderness, or guarding. Positive suprapubic tenderness. No CVA tenderness bilaterally.  Msk:   Strength and tone normal for age. Extremities/Skin: Warm and dry. No clubbing or cyanosis. No edema.  Neuro: Alert and oriented X 3. Moves all extremities spontaneously. Gait is normal. CNII-XII grossly in tact. Psych:  Responds to questions appropriately with a normal affect.   Labs: Results for orders placed in visit on 12/26/12  POCT UA - MICROSCOPIC ONLY      Result Value Range   WBC, Ur, HPF, POC 2-4     RBC, urine, microscopic 0-2     Bacteria, U Microscopic trace     Mucus, UA neg     Epithelial cells, urine per micros 1-3     Crystals, Ur, HPF, POC neg     Casts, Ur, LPF, POC neg     Yeast, UA neg    POCT URINALYSIS DIPSTICK      Result Value Range   Color, UA orange-red     Clarity, UA clear     Glucose, UA neg     Bilirubin, UA small     Ketones, UA a5     Spec Grav, UA <=1.005     Blood, UA neg     pH, UA 5.0     Protein, UA 100     Urobilinogen, UA 4.0     Nitrite, UA pos     Leukocytes, UA large (3+)       ASSESSMENT AND PLAN:  37 y.o. year old female with urinary tract infection Urine culture sent Increase fluids Cipro 500 mg bid x 5 days Azo as needed for symptomatic relief Follow up if symptoms worsen or fail to improve.  Angela Nevin, PA-C 12/26/2012 11:13 AM

## 2012-12-27 LAB — URINE CULTURE: Organism ID, Bacteria: NO GROWTH

## 2013-04-04 ENCOUNTER — Ambulatory Visit (INDEPENDENT_AMBULATORY_CARE_PROVIDER_SITE_OTHER): Payer: 59 | Admitting: General Surgery

## 2013-04-11 ENCOUNTER — Ambulatory Visit (INDEPENDENT_AMBULATORY_CARE_PROVIDER_SITE_OTHER): Payer: 59 | Admitting: General Surgery

## 2013-04-11 ENCOUNTER — Encounter (INDEPENDENT_AMBULATORY_CARE_PROVIDER_SITE_OTHER): Payer: Self-pay | Admitting: General Surgery

## 2013-04-11 VITALS — BP 110/62 | HR 70 | Resp 16 | Ht 69.0 in | Wt 142.0 lb

## 2013-04-11 DIAGNOSIS — K644 Residual hemorrhoidal skin tags: Secondary | ICD-10-CM | POA: Insufficient documentation

## 2013-04-11 NOTE — Patient Instructions (Signed)
Increase the amount of fiber in her diet as we discussed. The goal is 30 grams per day. Each more raw fruits and vegetables.

## 2013-04-11 NOTE — Progress Notes (Signed)
Patient ID: Michelle Bartlett, female   DOB: 23-Jan-1976, 38 y.o.   MRN: [id number]  Chief Complaint  Patient presents with  . Other    Eval ext hems    HPI Michelle Bartlett is a 38 y.o. female.   HPI  She is referred by Dr. Ronita Hipps for evaluation of painful external hemorrhoids.  She has had this problem for many years. I saw her many years ago because of a similar situation. She recently had a thrombosed external hemorrhoid.  She's had a number of incisions and drainages of thrombosed external hemorrhoids. She has periods where she does not have a bowel movement for 7-10 days. Following that she has multiple liquid bowel movements per day. She uses multiple wipes to clean herself. She has significant discomfort with these bowel movements. She also has some bright red bleeding. She has known internal hemorrhoids as well.  Past Medical History  Diagnosis Date  . H/O varicella   . Headache(784.0)   . Decreased fetal movements, affecting management of mother, unspecified as to episode of care in pregnancy   . Other specified complication of pregnancy, unspecified as to episode of care     back pain  . Threatened premature labor, antepartum(644.03)   . Attention deficit disorder with hyperactivity(314.01)   . GERD (gastroesophageal reflux disease)   . Gastric ulcer, unspecified as acute or chronic, without mention of hemorrhage, perforation, or obstruction   . Complication of anesthesia     spinal headache with epidural even in labor with second child  . Postpartum care following vaginal delivery F (02/04/12) 02/05/2012    History reviewed. No pertinent past surgical history.  Family History  Problem Relation Age of Onset  . Cancer Mother     breast  . Urolithiasis Father   . Hypertension Father   . Depression Maternal Grandmother   . Heart attack Maternal Grandfather   . Stroke Maternal Grandfather   . Cancer Maternal Grandfather     prostate  . Diabetes Maternal Grandfather   .  Cancer Paternal Grandmother     breast  . Hypertension Paternal Grandmother   . Heart attack Paternal Grandfather   . Hypertension Paternal Grandfather     Social History History  Substance Use Topics  . Smoking status: Never Smoker   . Smokeless tobacco: Never Used  . Alcohol Use: No    Allergies  Allergen Reactions  . Codeine Nausea And Vomiting    Current Outpatient Prescriptions  Medication Sig Dispense Refill  . acetaminophen (TYLENOL) 325 MG tablet Take 650 mg by mouth every 6 (six) hours as needed.      Marland Kitchen oxyCODONE-acetaminophen (ROXICET) 5-325 MG/5ML solution Take by mouth every 4 (four) hours as needed for severe pain.       No current facility-administered medications for this visit.    Review of Systems Review of Systems  Constitutional: Negative.   Respiratory: Negative.   Cardiovascular: Negative.   Gastrointestinal: Positive for diarrhea, constipation and anal bleeding.    Blood pressure 110/62, pulse 70, resp. rate 16, height 5' 9"  (1.753 m), weight 142 lb (64.411 kg), not currently breastfeeding.  Physical Exam Physical Exam  Constitutional: She appears well-developed and well-nourished. No distress.  HENT:  Head: Normocephalic and atraumatic.  Genitourinary:  Multiple small, non-swollen external hemorrhoids are present. No fissures present. On digital rectal exam shows normal sphincter tone. The digital rectal exam is uncomfortable for her. No masses are palpable. She would not tolerate an anoscope exam.  Data Reviewed Dr. Kennith Maes notes.  Assessment    Painful external hemorrhoids with recurrent thromboses. Her stimulus is the fact that she has severe constipation followed by severe diarrhea. I explained to her that I felt that if we can get her on a regular bowel regimen, her symptoms would decrease dramatically.     Plan    I would not recommend surgery at this time.  Need to get rid of the stimulus by improving her bowel habits. Have  recommended increasing raw fruits and vegetables and fiber in the diet until she's getting 30 g per day. This is to be tried for a month. If this does not help her, have recommend referral to Dr. Collene Mares of gastroenterology.        Michelle Bartlett 04/11/2013, 3:15 PM

## 2013-04-12 ENCOUNTER — Telehealth (INDEPENDENT_AMBULATORY_CARE_PROVIDER_SITE_OTHER): Payer: Self-pay | Admitting: General Surgery

## 2013-04-12 NOTE — Telephone Encounter (Signed)
Pt called to ask if Dr. Zella Richer will give her a Rx for Percocet for the pain of her hemorrhoids.  Dr. Kennith Maes office is closed and he usually will help her with this.  She is using topical Lidocaine and hydrocortisone suppositories, as well as using sitz baths.  She had three very young children and must lift and carry them.  Explained that it is unlikely Dr. Zella Richer will prescribe narcotics at this time, so she will call Dr. Kennith Maes office tomorrow.

## 2013-05-17 ENCOUNTER — Other Ambulatory Visit: Payer: Self-pay | Admitting: Gastroenterology

## 2013-05-17 DIAGNOSIS — R109 Unspecified abdominal pain: Secondary | ICD-10-CM

## 2013-05-24 ENCOUNTER — Ambulatory Visit
Admission: RE | Admit: 2013-05-24 | Discharge: 2013-05-24 | Disposition: A | Discharge status: Home / Self Care | Payer: 59 | Source: Ambulatory Visit | Attending: Gastroenterology | Admitting: Gastroenterology

## 2013-05-24 DIAGNOSIS — R109 Unspecified abdominal pain: Secondary | ICD-10-CM

## 2013-05-25 ENCOUNTER — Other Ambulatory Visit: Payer: Self-pay | Admitting: Gastroenterology

## 2013-05-25 DIAGNOSIS — R109 Unspecified abdominal pain: Secondary | ICD-10-CM

## 2013-05-26 ENCOUNTER — Other Ambulatory Visit: Payer: 59

## 2013-05-31 ENCOUNTER — Other Ambulatory Visit: Payer: 59

## 2013-12-19 ENCOUNTER — Encounter (INDEPENDENT_AMBULATORY_CARE_PROVIDER_SITE_OTHER): Payer: Self-pay | Admitting: General Surgery

## 2015-03-11 IMAGING — CR DG FOOT COMPLETE 3+V*R*
3 series · 3 of 3 positions shown · non-contrast
Comparison: None.

CLINICAL DATA: Inversion injury.

EXAM:
RIGHT FOOT COMPLETE - 3+ VIEW

[view not recorded (1 of 3)]
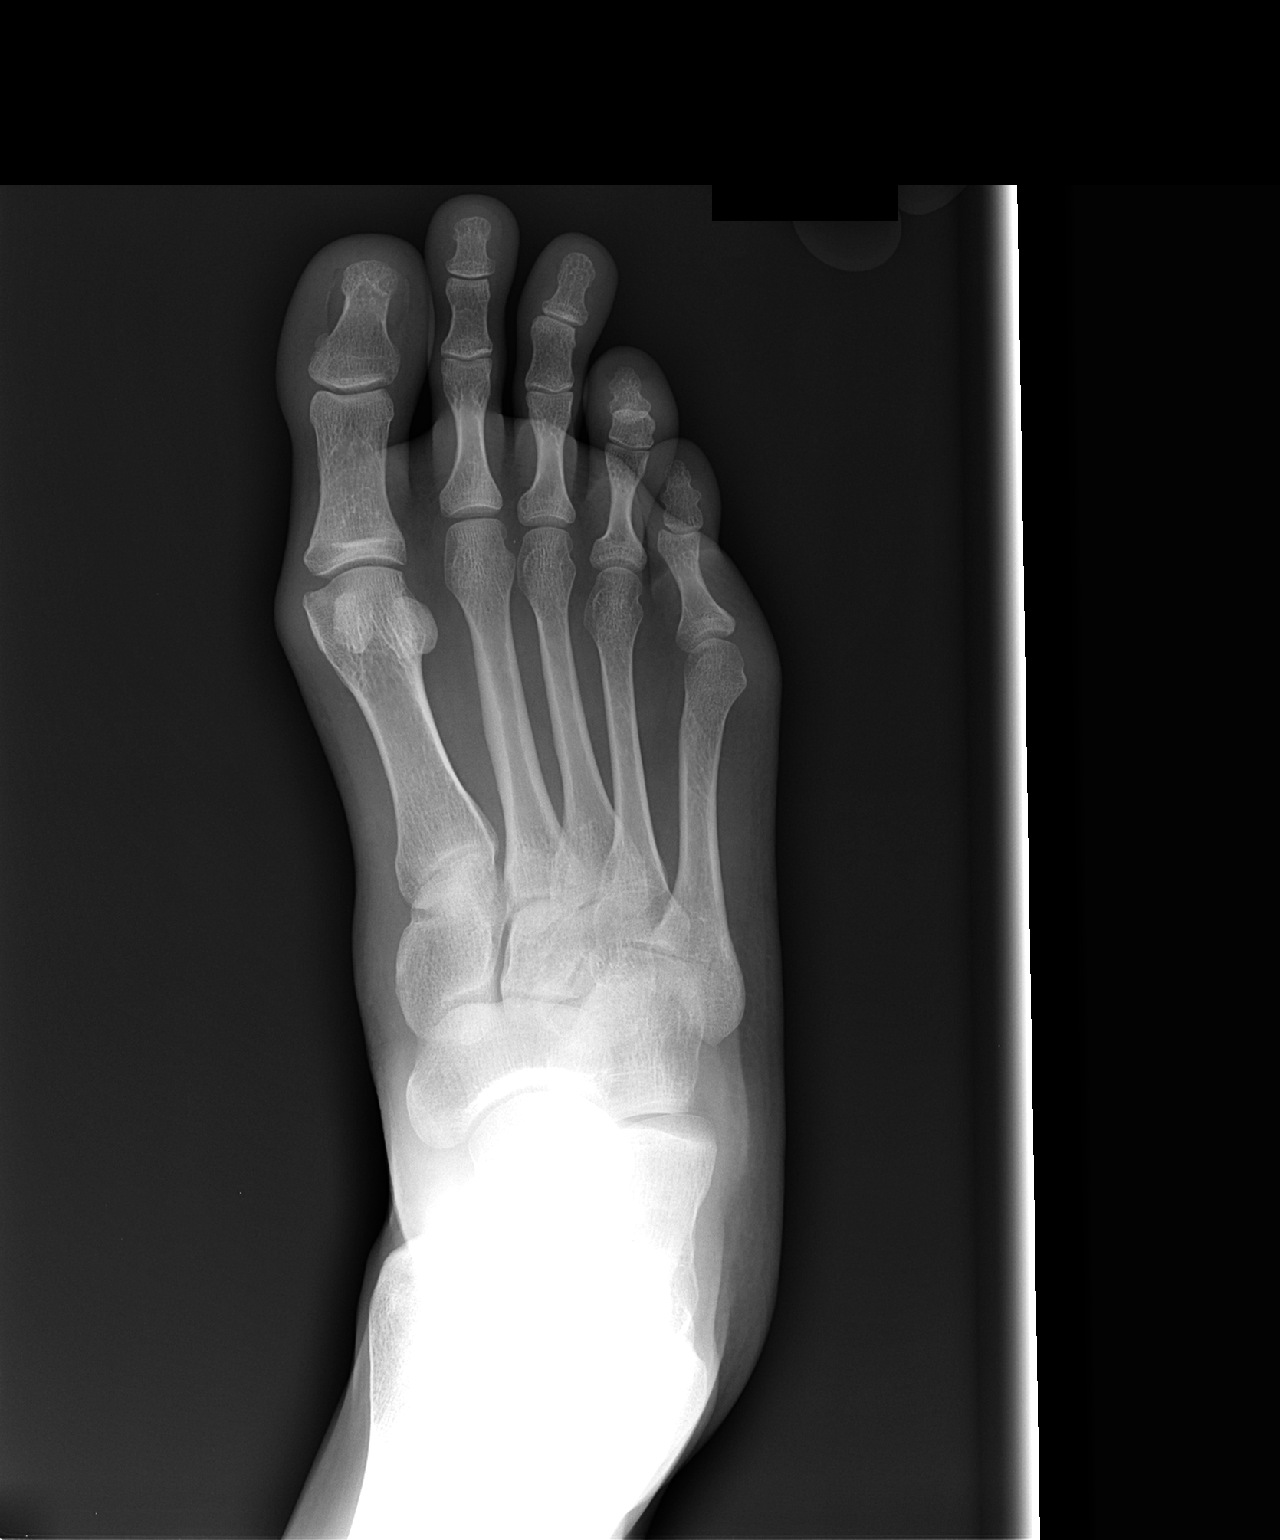

[view not recorded (2 of 3)]
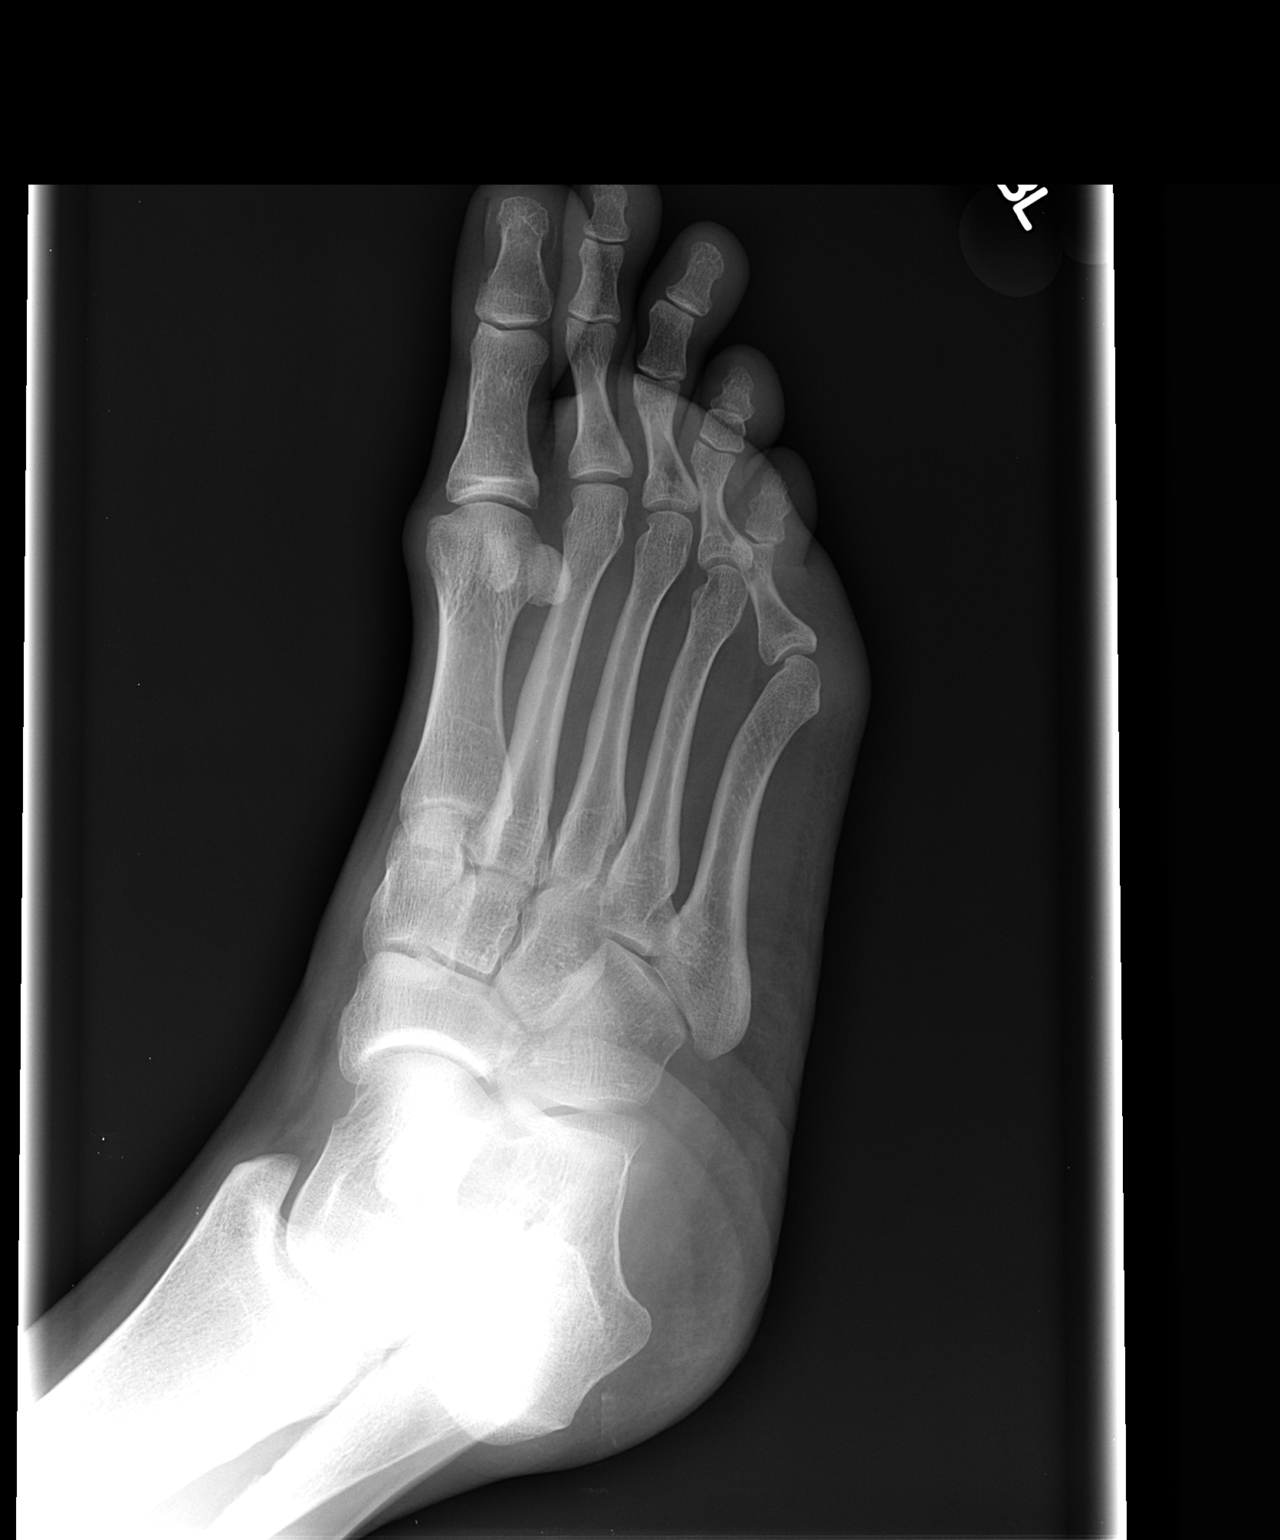

[view not recorded (3 of 3)]
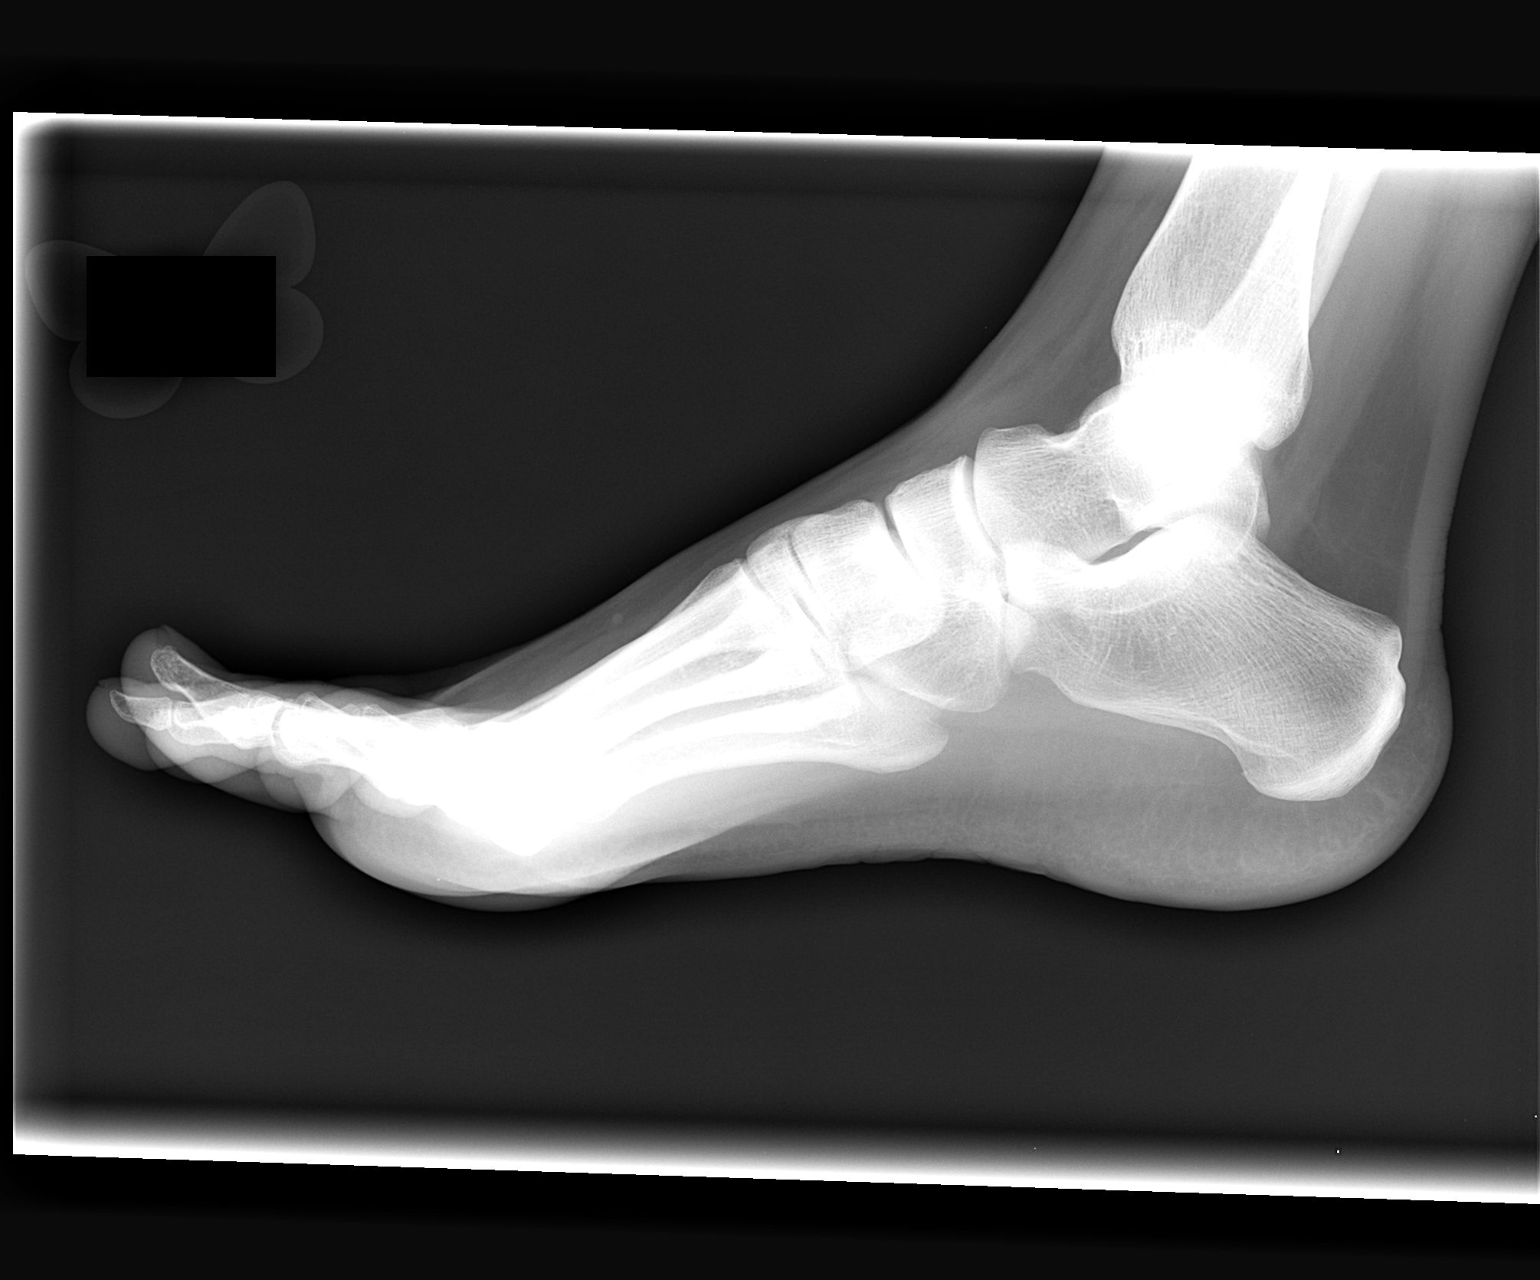

[3 of 3 positions shown; findings below may reference images not displayed]

FINDINGS: There is no evidence of fracture or dislocation. There is no
evidence of arthropathy or other focal bone abnormality. Soft
tissues are unremarkable.
IMPRESSION: Negative.

## 2015-05-25 DIAGNOSIS — F4323 Adjustment disorder with mixed anxiety and depressed mood: Secondary | ICD-10-CM | POA: Diagnosis not present

## 2015-05-31 DIAGNOSIS — F4323 Adjustment disorder with mixed anxiety and depressed mood: Secondary | ICD-10-CM | POA: Diagnosis not present

## 2015-06-06 DIAGNOSIS — F4323 Adjustment disorder with mixed anxiety and depressed mood: Secondary | ICD-10-CM | POA: Diagnosis not present

## 2015-06-08 DIAGNOSIS — F4323 Adjustment disorder with mixed anxiety and depressed mood: Secondary | ICD-10-CM | POA: Diagnosis not present

## 2015-06-08 DIAGNOSIS — N983 Complications of attempted introduction of embryo in embryo transfer: Secondary | ICD-10-CM | POA: Diagnosis not present

## 2015-06-08 DIAGNOSIS — R5383 Other fatigue: Secondary | ICD-10-CM | POA: Diagnosis not present

## 2015-06-27 DIAGNOSIS — Z6822 Body mass index (BMI) 22.0-22.9, adult: Secondary | ICD-10-CM | POA: Diagnosis not present

## 2015-06-27 DIAGNOSIS — D509 Iron deficiency anemia, unspecified: Secondary | ICD-10-CM | POA: Diagnosis not present

## 2015-06-27 DIAGNOSIS — F909 Attention-deficit hyperactivity disorder, unspecified type: Secondary | ICD-10-CM | POA: Diagnosis not present

## 2015-06-27 DIAGNOSIS — F411 Generalized anxiety disorder: Secondary | ICD-10-CM | POA: Diagnosis not present

## 2015-06-29 DIAGNOSIS — F4323 Adjustment disorder with mixed anxiety and depressed mood: Secondary | ICD-10-CM | POA: Diagnosis not present

## 2015-07-04 DIAGNOSIS — F4323 Adjustment disorder with mixed anxiety and depressed mood: Secondary | ICD-10-CM | POA: Diagnosis not present

## 2015-07-11 DIAGNOSIS — F4323 Adjustment disorder with mixed anxiety and depressed mood: Secondary | ICD-10-CM | POA: Diagnosis not present

## 2015-07-20 DIAGNOSIS — F4323 Adjustment disorder with mixed anxiety and depressed mood: Secondary | ICD-10-CM | POA: Diagnosis not present

## 2015-10-03 IMAGING — CR DG SMALL BOWEL
1 series · 1 of 1 positions shown · non-contrast
Comparison: None.

CLINICAL DATA: Abdominal pain, evaluate for inflammatory bowel
disease

EXAM:
SMALL BOWEL SERIES
TECHNIQUE: Following ingestion of thin barium, serial small bowel images were
obtained including spot views of the terminal ileum.
FLUOROSCOPY TIME:  36 seconds

[t abdomen supine]
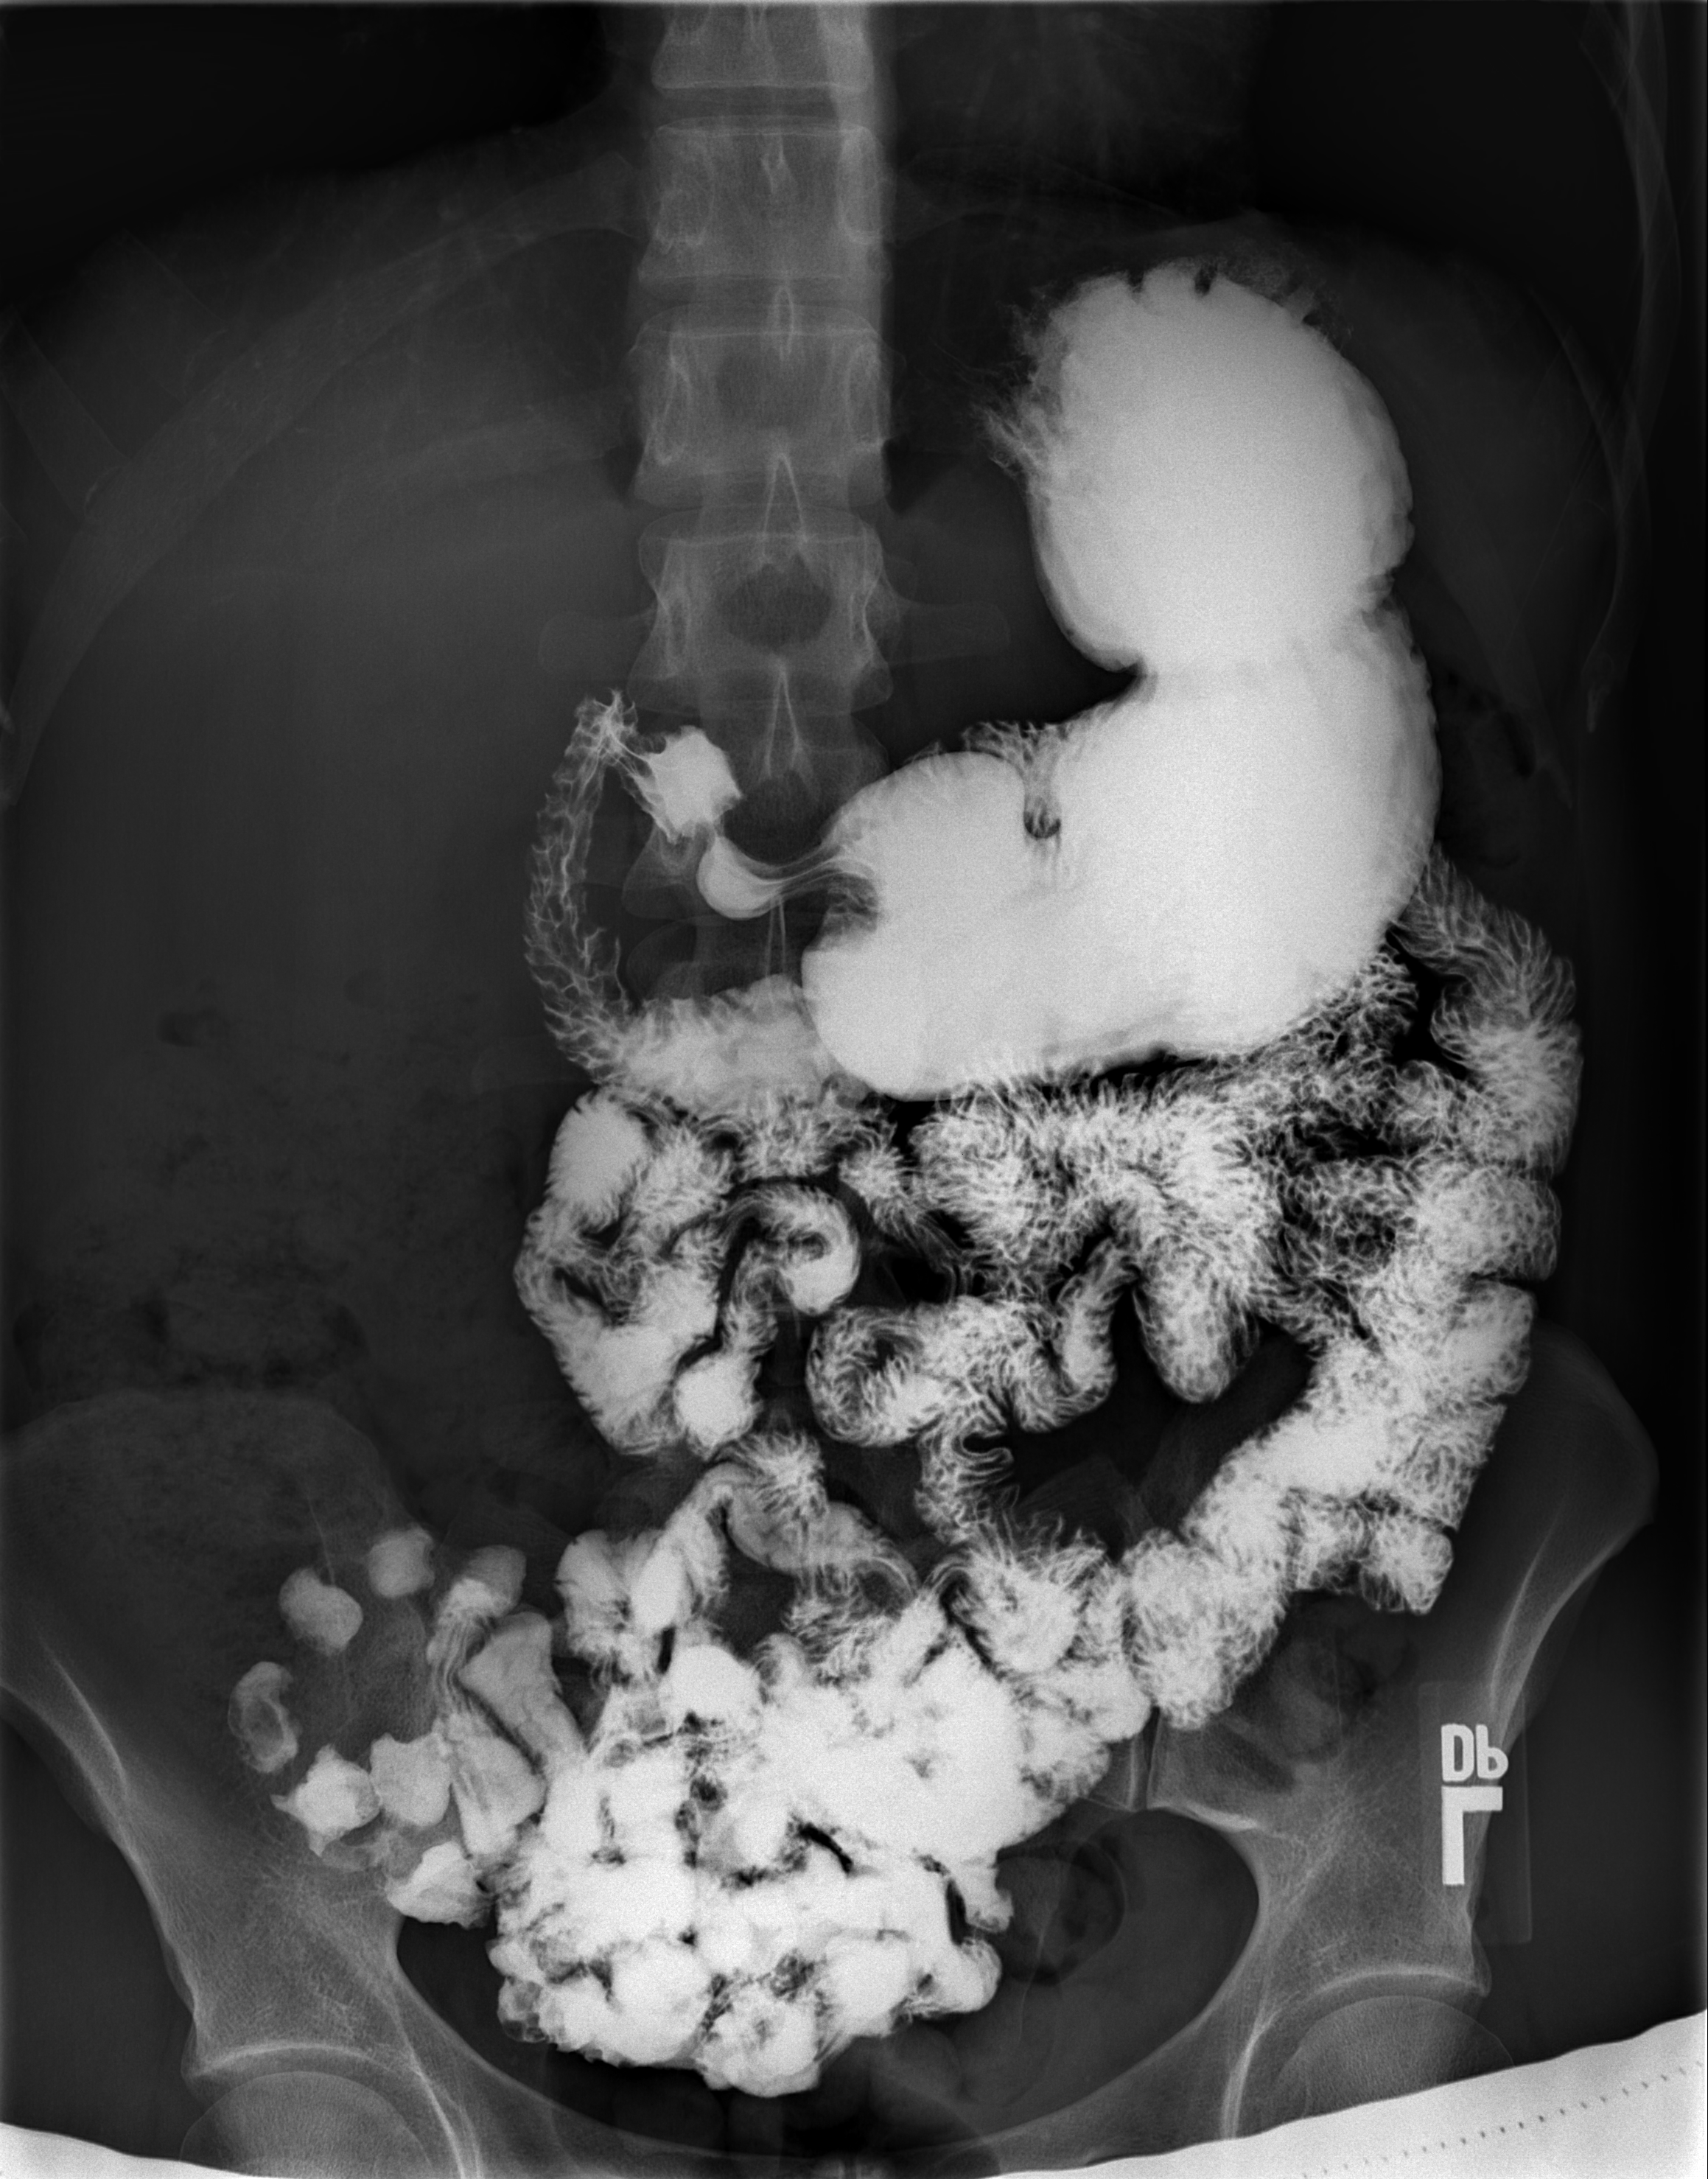

[1 of 1 positions shown; findings below may reference images not displayed]

FINDINGS: A preliminary film of the abdomen shows a moderate amount of feces
throughout the colon. No bowel obstruction is seen. No opaque
calculi are noted.

Barium was given orally and images of the small bowel were obtained.
The mucosal pattern of the small bowel is normal. No edema, mass, or
displacement of small bowel loops is seen. The terminal ileum is
well visualized and appears normal.
IMPRESSION: Negative small bowel follow-through. The terminal ileum appears
normal.

## 2016-09-10 ENCOUNTER — Other Ambulatory Visit: Payer: Self-pay | Admitting: Obstetrics and Gynecology

## 2016-09-10 ENCOUNTER — Encounter (HOSPITAL_COMMUNITY): Payer: Self-pay | Admitting: *Deleted

## 2016-09-11 ENCOUNTER — Ambulatory Visit (HOSPITAL_COMMUNITY): Payer: Self-pay | Admitting: Certified Registered Nurse Anesthetist

## 2016-09-11 ENCOUNTER — Encounter (HOSPITAL_COMMUNITY)
Admission: RE | Disposition: A | Discharge status: Home / Self Care | Payer: Self-pay | Source: Ambulatory Visit | Attending: Obstetrics and Gynecology

## 2016-09-11 ENCOUNTER — Encounter (HOSPITAL_COMMUNITY): Payer: Self-pay

## 2016-09-11 ENCOUNTER — Ambulatory Visit (HOSPITAL_COMMUNITY)
Admission: RE | Admit: 2016-09-11 | Discharge: 2016-09-11 | Disposition: A | Discharge status: Home / Self Care | Payer: Self-pay | Source: Ambulatory Visit | Attending: Obstetrics and Gynecology | Admitting: Obstetrics and Gynecology

## 2016-09-11 DIAGNOSIS — O09521 Supervision of elderly multigravida, first trimester: Secondary | ICD-10-CM | POA: Insufficient documentation

## 2016-09-11 DIAGNOSIS — Z8711 Personal history of peptic ulcer disease: Secondary | ICD-10-CM | POA: Insufficient documentation

## 2016-09-11 DIAGNOSIS — Z3A10 10 weeks gestation of pregnancy: Secondary | ICD-10-CM | POA: Insufficient documentation

## 2016-09-11 DIAGNOSIS — O021 Missed abortion: Secondary | ICD-10-CM | POA: Insufficient documentation

## 2016-09-11 HISTORY — PX: DILATION AND EVACUATION: SHX1459

## 2016-09-11 LAB — CBC
HCT: 34.9 % — ABNORMAL LOW (ref 36.0–46.0)
HEMOGLOBIN: 11.6 g/dL — AB (ref 12.0–15.0)
MCH: 30.9 pg (ref 26.0–34.0)
MCHC: 33.2 g/dL (ref 30.0–36.0)
MCV: 92.8 fL (ref 78.0–100.0)
Platelets: 287 10*3/uL (ref 150–400)
RBC: 3.76 MIL/uL — AB (ref 3.87–5.11)
RDW: 15.1 % (ref 11.5–15.5)
WBC: 7.6 10*3/uL (ref 4.0–10.5)

## 2016-09-11 SURGERY — DILATION AND EVACUATION, UTERUS
Anesthesia: General

## 2016-09-11 MED ORDER — HYDROMORPHONE HCL 1 MG/ML IJ SOLN
0.2500 mg | INTRAMUSCULAR | Status: DC | PRN
  Administered 2016-09-11: 0.25 mg via INTRAVENOUS

## 2016-09-11 MED ORDER — DEXAMETHASONE SODIUM PHOSPHATE 4 MG/ML IJ SOLN
INTRAMUSCULAR | Status: AC
  Filled 2016-09-11: qty 1

## 2016-09-11 MED ORDER — SCOPOLAMINE 1 MG/3DAYS TD PT72
1.0000 | MEDICATED_PATCH | Freq: Once | TRANSDERMAL | Status: DC
  Administered 2016-09-11: 1.5 mg via TRANSDERMAL

## 2016-09-11 MED ORDER — LIDOCAINE HCL (CARDIAC) 20 MG/ML IV SOLN
INTRAVENOUS | Status: DC | PRN
  Administered 2016-09-11: 60 mg via INTRAVENOUS

## 2016-09-11 MED ORDER — TRAMADOL HCL 50 MG PO TABS: 50.0000 mg | ORAL_TABLET | Freq: Four times a day (QID) | ORAL | 0 refills | Status: DC | PRN

## 2016-09-11 MED ORDER — BUPIVACAINE HCL (PF) 0.25 % IJ SOLN
INTRAMUSCULAR | Status: AC
  Filled 2016-09-11: qty 30

## 2016-09-11 MED ORDER — RHO D IMMUNE GLOBULIN 1500 UNIT/2ML IJ SOSY
300.0000 ug | PREFILLED_SYRINGE | Freq: Once | INTRAMUSCULAR | Status: DC
  Filled 2016-09-11: qty 2

## 2016-09-11 MED ORDER — PROMETHAZINE HCL 25 MG/ML IJ SOLN
INTRAMUSCULAR | Status: AC
  Filled 2016-09-11: qty 1

## 2016-09-11 MED ORDER — LACTATED RINGERS IV SOLN
INTRAVENOUS | Status: DC
  Administered 2016-09-11 (×2): via INTRAVENOUS

## 2016-09-11 MED ORDER — KETOROLAC TROMETHAMINE 30 MG/ML IJ SOLN
INTRAMUSCULAR | Status: AC
  Filled 2016-09-11: qty 1

## 2016-09-11 MED ORDER — RHO D IMMUNE GLOBULIN 1500 UNIT/2ML IJ SOSY
300.0000 ug | PREFILLED_SYRINGE | Freq: Once | INTRAMUSCULAR | Status: AC
  Administered 2016-09-11: 300 ug via INTRAMUSCULAR
  Filled 2016-09-11: qty 2

## 2016-09-11 MED ORDER — FENTANYL CITRATE (PF) 100 MCG/2ML IJ SOLN
INTRAMUSCULAR | Status: AC
  Filled 2016-09-11: qty 2

## 2016-09-11 MED ORDER — KETOROLAC TROMETHAMINE 30 MG/ML IJ SOLN
INTRAMUSCULAR | Status: DC | PRN
  Administered 2016-09-11: 30 mg via INTRAVENOUS

## 2016-09-11 MED ORDER — CHLOROPROCAINE HCL 1 % IJ SOLN
INTRAMUSCULAR | Status: AC
  Filled 2016-09-11: qty 30

## 2016-09-11 MED ORDER — CEFAZOLIN SODIUM-DEXTROSE 2-4 GM/100ML-% IV SOLN
2.0000 g | INTRAVENOUS | Status: AC
  Administered 2016-09-11: 2 g via INTRAVENOUS

## 2016-09-11 MED ORDER — SODIUM CHLORIDE 0.9 % IV SOLN
INTRAVENOUS | Status: DC
  Administered 2016-09-11: 500 mL via INTRAVENOUS

## 2016-09-11 MED ORDER — SCOPOLAMINE 1 MG/3DAYS TD PT72
MEDICATED_PATCH | TRANSDERMAL | Status: AC
  Administered 2016-09-11: 1.5 mg via TRANSDERMAL
  Filled 2016-09-11: qty 1

## 2016-09-11 MED ORDER — PROMETHAZINE HCL 25 MG/ML IJ SOLN
6.2500 mg | INTRAMUSCULAR | Status: DC | PRN
  Administered 2016-09-11: 6.25 mg via INTRAVENOUS

## 2016-09-11 MED ORDER — ONDANSETRON HCL 4 MG/2ML IJ SOLN
INTRAMUSCULAR | Status: AC
  Filled 2016-09-11: qty 2

## 2016-09-11 MED ORDER — FENTANYL CITRATE (PF) 100 MCG/2ML IJ SOLN
INTRAMUSCULAR | Status: DC | PRN
  Administered 2016-09-11 (×2): 50 ug via INTRAVENOUS

## 2016-09-11 MED ORDER — HYDROMORPHONE HCL 1 MG/ML IJ SOLN
INTRAMUSCULAR | Status: AC
  Filled 2016-09-11: qty 0.5

## 2016-09-11 MED ORDER — PROPOFOL 10 MG/ML IV BOLUS
INTRAVENOUS | Status: AC
  Filled 2016-09-11: qty 40

## 2016-09-11 MED ORDER — MIDAZOLAM HCL 2 MG/2ML IJ SOLN
INTRAMUSCULAR | Status: DC | PRN
  Administered 2016-09-11: 2 mg via INTRAVENOUS

## 2016-09-11 MED ORDER — MIDAZOLAM HCL 2 MG/2ML IJ SOLN
INTRAMUSCULAR | Status: AC
  Filled 2016-09-11: qty 2

## 2016-09-11 MED ORDER — ONDANSETRON HCL 4 MG/2ML IJ SOLN
INTRAMUSCULAR | Status: DC | PRN
  Administered 2016-09-11: 4 mg via INTRAVENOUS

## 2016-09-11 MED ORDER — PROPOFOL 10 MG/ML IV BOLUS
INTRAVENOUS | Status: DC | PRN
  Administered 2016-09-11: 180 mg via INTRAVENOUS

## 2016-09-11 MED ORDER — BUPIVACAINE HCL (PF) 0.25 % IJ SOLN
INTRAMUSCULAR | Status: DC | PRN
  Administered 2016-09-11: 20 mL

## 2016-09-11 MED ORDER — DEXAMETHASONE SODIUM PHOSPHATE 4 MG/ML IJ SOLN
INTRAMUSCULAR | Status: DC | PRN
Start: 2016-09-11 — End: 2016-09-11
  Administered 2016-09-11: 4 mg via INTRAVENOUS

## 2016-09-11 MED ORDER — LIDOCAINE HCL (CARDIAC) 20 MG/ML IV SOLN
INTRAVENOUS | Status: AC
  Filled 2016-09-11: qty 5

## 2016-09-11 SURGICAL SUPPLY — 18 items
CATH ROBINSON RED A/P 16FR (CATHETERS) ×2 IMPLANT
CLOTH BEACON ORANGE TIMEOUT ST (SAFETY) ×2 IMPLANT
DECANTER SPIKE VIAL GLASS SM (MISCELLANEOUS) ×2 IMPLANT
GLOVE BIO SURGEON STRL SZ7.5 (GLOVE) ×2 IMPLANT
GLOVE BIOGEL PI IND STRL 7.0 (GLOVE) ×1 IMPLANT
GLOVE BIOGEL PI INDICATOR 7.0 (GLOVE) ×1
GOWN STRL REUS W/TWL LRG LVL3 (GOWN DISPOSABLE) ×4 IMPLANT
KIT BERKELEY 1ST TRIMESTER 3/8 (MISCELLANEOUS) ×2 IMPLANT
NS IRRIG 1000ML POUR BTL (IV SOLUTION) ×2 IMPLANT
PACK VAGINAL MINOR WOMEN LF (CUSTOM PROCEDURE TRAY) ×2 IMPLANT
PAD OB MATERNITY 4.3X12.25 (PERSONAL CARE ITEMS) ×2 IMPLANT
PAD PREP 24X48 CUFFED NSTRL (MISCELLANEOUS) ×2 IMPLANT
SET BERKELEY SUCTION TUBING (SUCTIONS) ×2 IMPLANT
TOWEL OR 17X24 6PK STRL BLUE (TOWEL DISPOSABLE) ×4 IMPLANT
VACURETTE 10 RIGID CVD (CANNULA) IMPLANT
VACURETTE 7MM CVD STRL WRAP (CANNULA) IMPLANT
VACURETTE 8 RIGID CVD (CANNULA) IMPLANT
VACURETTE 9 RIGID CVD (CANNULA) ×2 IMPLANT

## 2016-09-11 NOTE — Anesthesia Preprocedure Evaluation (Addendum)
Anesthesia Evaluation  Patient identified by MRN, date of birth, ID band Patient awake    Reviewed: Allergy & Precautions, NPO status , Patient's Chart, lab work & pertinent test results  Airway Mallampati: III  TM Distance: >3 FB Neck ROM: Full    Dental no notable dental hx.    Pulmonary neg pulmonary ROS,    Pulmonary exam normal breath sounds clear to auscultation       Cardiovascular negative cardio ROS Normal cardiovascular exam Rhythm:Regular Rate:Normal     Neuro/Psych negative neurological ROS  negative psych ROS   GI/Hepatic Neg liver ROS, PUD,   Endo/Other  negative endocrine ROS  Renal/GU negative Renal ROS     Musculoskeletal   Abdominal   Peds  Hematology negative hematology ROS (+)   Anesthesia Other Findings   Reproductive/Obstetrics                            Anesthesia Physical Anesthesia Plan  ASA: II  Anesthesia Plan: General   Post-op Pain Management:    Induction: Intravenous  PONV Risk Score and Plan: 2 and Ondansetron, Dexamethasone, Midazolam and Propofol  Airway Management Planned: LMA  Additional Equipment:   Intra-op Plan:   Post-operative Plan: Extubation in OR  Informed Consent: I have reviewed the patients History and Physical, chart, labs and discussed the procedure including the risks, benefits and alternatives for the proposed anesthesia with the patient or authorized representative who has indicated his/her understanding and acceptance.   Dental advisory given  Plan Discussed with: CRNA  Anesthesia Plan Comments:        Anesthesia Quick Evaluation

## 2016-09-11 NOTE — Op Note (Signed)
09/11/2016  1:21 PM  PATIENT:  Michelle Bartlett  41 y.o. female  PRE-OPERATIVE DIAGNOSIS:  Missed Abortion, Twins  POST-OPERATIVE DIAGNOSIS:  Missed Abortion, Twins  PROCEDURE:  Procedure(s): DILATATION AND EVACUATION  SURGEON:  Surgeon(s): Brien Few, MD  ASSISTANTS: none   ANESTHESIA:   local and general  ESTIMATED BLOOD LOSS: 20cc  DRAINS: none   LOCAL MEDICATIONS USED:  MARCAINE    and Amount: 20 ml  SPECIMEN:  Source of Specimen:  poc  DISPOSITION OF SPECIMEN:  Source of Specimen:  poc  COUNTS:  YES  DICTATION #: [id number]  PLAN OF CARE: dc home  PATIENT DISPOSITION:  PACU - hemodynamically stable.

## 2016-09-11 NOTE — Anesthesia Postprocedure Evaluation (Signed)
Anesthesia Post Note  Patient: AMBRIA MAYFIELD  Procedure(s) Performed: Procedure(s) (LRB): DILATATION AND EVACUATION (N/A)     Patient location during evaluation: PACU Anesthesia Type: General Level of consciousness: awake and alert Pain management: pain level controlled Vital Signs Assessment: post-procedure vital signs reviewed and stable Respiratory status: spontaneous breathing, nonlabored ventilation, respiratory function stable and patient connected to nasal cannula oxygen Cardiovascular status: blood pressure returned to baseline and stable Postop Assessment: no signs of nausea or vomiting Anesthetic complications: no    Last Vitals:  Vitals:   09/11/16 1333 09/11/16 1430  BP: 128/90   Pulse: 91   Resp: 13   Temp: 36.5 C 37 C    Last Pain:  Vitals:   09/11/16 1400  TempSrc:   PainSc: Asleep   Pain Goal: Patients Stated Pain Goal: 3 (09/11/16 1345)               Fionnuala Hemmerich P Sunjai Levandoski

## 2016-09-11 NOTE — H&P (Signed)
Michelle Bartlett is an 41 y.o. female. MAB twin gestation.  Pertinent Gynecological History: Menses: flow is moderate Bleeding: dysfunctional uterine bleeding Contraception: none DES exposure: denies Blood transfusions: none Sexually transmitted diseases: no past history Previous GYN Procedures: DNC  Last mammogram: na Date: na Last pap: normal Date: 2018 OB History: G3, P2   Menstrual History: Menarche age: 54 No LMP recorded. Patient is pregnant.    Past Medical History:  Diagnosis Date  . Attention deficit disorder with hyperactivity(314.01)   . Complication of anesthesia    spinal headache with epidural even in labor with second child  . Decreased fetal movements, affecting management of mother, unspecified as to episode of care in pregnancy   . Gastric ulcer, unspecified as acute or chronic, without mention of hemorrhage, perforation, or obstruction   . GERD (gastroesophageal reflux disease)   . H/O varicella   . Headache(784.0)   . Other specified complication of pregnancy, unspecified as to episode of care    back pain  . Postpartum care following vaginal delivery F (02/04/12) 02/05/2012  . Threatened premature labor, antepartum(644.03)     Past Surgical History:  Procedure Laterality Date  . DILATION AND CURETTAGE OF UTERUS  2012    Family History  Problem Relation Age of Onset  . Cancer Mother        breast  . Urolithiasis Father   . Hypertension Father   . Depression Maternal Grandmother   . Heart attack Maternal Grandfather   . Stroke Maternal Grandfather   . Cancer Maternal Grandfather        prostate  . Diabetes Maternal Grandfather   . Cancer Paternal Grandmother        breast  . Hypertension Paternal Grandmother   . Heart attack Paternal Grandfather   . Hypertension Paternal Grandfather     Social History:  reports that she has never smoked. She has never used smokeless tobacco. She reports that she does not drink alcohol or use  drugs.  Allergies:  Allergies  Allergen Reactions  . Codeine Nausea And Vomiting    Prescriptions Prior to Admission  Medication Sig Dispense Refill Last Dose  . acetaminophen (TYLENOL) 325 MG tablet Take 650 mg by mouth every 6 (six) hours as needed.   Past Week at Unknown time    Review of Systems  Constitutional: Negative.   All other systems reviewed and are negative.   Blood pressure (!) 121/51, pulse 72, temperature 98.4 F (36.9 C), temperature source Oral, resp. rate 18, height 5' 8"  (1.727 m), weight 67.6 kg (149 lb), SpO2 99 %. Physical Exam  Nursing note and vitals reviewed. Constitutional: She is oriented to person, place, and time. She appears well-developed and well-nourished.  HENT:  Head: Normocephalic and atraumatic.  Neck: Normal range of motion. Neck supple.  Cardiovascular: Normal rate and regular rhythm.   Respiratory: Effort normal and breath sounds normal.  GI: Soft. Bowel sounds are normal.  Genitourinary: Vagina normal and uterus normal.  Musculoskeletal: Normal range of motion.  Neurological: She is alert and oriented to person, place, and time. She has normal reflexes.  Skin: Skin is warm and dry.  Psychiatric: She has a normal mood and affect.    Results for orders placed or performed during the hospital encounter of 09/11/16 (from the past 24 hour(s))  CBC     Status: Abnormal   Collection Time: 09/11/16 12:15 PM  Result Value Ref Range   WBC 7.6 4.0 - 10.5 K/uL   RBC  3.76 (L) 3.87 - 5.11 MIL/uL   Hemoglobin 11.6 (L) 12.0 - 15.0 g/dL   HCT 34.9 (L) 36.0 - 46.0 %   MCV 92.8 78.0 - 100.0 fL   MCH 30.9 26.0 - 34.0 pg   MCHC 33.2 30.0 - 36.0 g/dL   RDW 15.1 11.5 - 15.5 %   Platelets 287 150 - 400 K/uL    No results found.  Assessment/Plan: MAB twin gestation AMA D&E Risks  Vs benefits of surgery discussed. Declines medical or expectant options.  Consent explained, witnessed and signed.   Dodi Leu J 09/11/2016, 12:32 PM

## 2016-09-11 NOTE — Anesthesia Procedure Notes (Signed)
Procedure Name: LMA Insertion Date/Time: 09/11/2016 1:02 PM Performed by: Raenette Rover Pre-anesthesia Checklist: Patient identified, Emergency Drugs available, Suction available and Patient being monitored Patient Re-evaluated:Patient Re-evaluated prior to induction Oxygen Delivery Method: Circle system utilized Preoxygenation: Pre-oxygenation with 100% oxygen Induction Type: IV induction LMA: LMA with gastric port inserted LMA Size: 4.0 Number of attempts: 1 Placement Confirmation: positive ETCO2,  CO2 detector and breath sounds checked- equal and bilateral Tube secured with: Tape

## 2016-09-11 NOTE — Discharge Instructions (Addendum)
DISCHARGE INSTRUCTIONS: D&C / D&E The following instructions have been prepared to help you care for yourself upon your return home.   Personal hygiene:  Use sanitary pads for vaginal drainage, not tampons.  Shower the day after your procedure.  NO tub baths, pools or Jacuzzis for 2-3 weeks.  Wipe front to back after using the bathroom.  Activity and limitations:  Do NOT drive or operate any equipment for 24 hours. The effects of anesthesia are still present and drowsiness may result.  Do NOT rest in bed all day.  Walking is encouraged.  Walk up and down stairs slowly.  You may resume your normal activity in one to two days or as indicated by your physician.  Sexual activity: NO intercourse for at least 2 weeks after the procedure, or as indicated by your physician.  Diet: Eat a light meal as desired this evening. You may resume your usual diet tomorrow.  Return to work: You may resume your work activities in one to two days or as indicated by your doctor.  What to expect after your surgery: Expect to have vaginal bleeding/discharge for 2-3 days and spotting for up to 10 days. It is not unusual to have soreness for up to 1-2 weeks. You may have a slight burning sensation when you urinate for the first day. Mild cramps may continue for a couple of days. You may have a regular period in 2-6 weeks.  Call your doctor for any of the following:  Excessive vaginal bleeding, saturating and changing one pad every hour.  Inability to urinate 6 hours after discharge from hospital.  Pain not relieved by pain medication.  Fever of 100.4 F or greater.  Unusual vaginal discharge or odor.   Call for an appointment:    Patients signature: ______________________  Nurses signature ________________________  Support person's signature_______________________  Post Anesthesia Home Care Instructions  NO IBUPROFEN PRODUCTS UNTIL: 7:15 PM TONIGHT  Activity: Get plenty of rest for  the remainder of the day. A responsible individual must stay with you for 24 hours following the procedure.  For the next 24 hours, DO NOT: -Drive a car -Paediatric nurse -Drink alcoholic beverages -Take any medication unless instructed by your physician -Make any legal decisions or sign important papers.  Meals: Start with liquid foods such as gelatin or soup. Progress to regular foods as tolerated. Avoid greasy, spicy, heavy foods. If nausea and/or vomiting occur, drink only clear liquids until the nausea and/or vomiting subsides. Call your physician if vomiting continues.  Special Instructions/Symptoms: Your throat may feel dry or sore from the anesthesia or the breathing tube placed in your throat during surgery. If this causes discomfort, gargle with warm salt water. The discomfort should disappear within 24 hours.  If you had a scopolamine patch placed behind your ear for the management of post- operative nausea and/or vomiting:  1. The medication in the patch is effective for 72 hours, after which it should be removed.  Wrap patch in a tissue and discard in the trash. Wash hands thoroughly with soap and water. 2. You may remove the patch earlier than 72 hours if you experience unpleasant side effects which may include dry mouth, dizziness or visual disturbances. 3. Avoid touching the patch. Wash your hands with soap and water after contact with the patch.

## 2016-09-11 NOTE — Transfer of Care (Signed)
Immediate Anesthesia Transfer of Care Note  Patient: Michelle Bartlett  Procedure(s) Performed: Procedure(s): DILATATION AND EVACUATION (N/A)  Patient Location: PACU  Anesthesia Type:General  Level of Consciousness: awake, alert  and oriented  Airway & Oxygen Therapy: Patient Spontanous Breathing  Post-op Assessment: Report given to RN and Post -op Vital signs reviewed and stable  Post vital signs: Reviewed and stable  Last Vitals:  Vitals:   09/11/16 1222  BP: (!) 121/51  Pulse: 72  Resp: 18  Temp: 36.9 C    Last Pain:  Vitals:   09/11/16 1222  TempSrc: Oral      Patients Stated Pain Goal: 3 (97/58/83 2549)  Complications: No apparent anesthesia complications

## 2016-09-11 NOTE — Op Note (Signed)
NAME:  Michelle Bartlett,                       ACCOUNT NO.:  [id number]  MEDICAL RECORD NO.:  [id number]  LOCATION:                                 FACILITY:  PHYSICIAN:  Lovenia Kim, M.D.     DATE OF BIRTH:  DATE OF PROCEDURE: DATE OF DISCHARGE:                              OPERATIVE REPORT   PREOPERATIVE DIAGNOSIS:  Missed abortion, twins, 10 weeks.  POSTOPERATIVE DIAGNOSIS:  Missed abortion, twins, 10 weeks.  PROCEDURE:  Suction D and E.  SURGEON:  Lovenia Kim, M.D.  ASSISTANT:  None.  ANESTHESIA:  General and local.  ESTIMATED BLOOD LOSS:  20 mL.  COMPLICATIONS:  None.  DRAINS:  None.  COUNTS:  Correct.  DISPOSITION:  The patient to Recovery in good condition.  PATHOLOGY:  Products of conception to Pathology.  BRIEF OPERATIVE NOTE:  After being apprised of risks of anesthesia, infection, bleeding, injury to surrounding organs, possible need for repair, delayed versus immediate complications to include bowel and bladder injury, possible need for repair, the patient was brought to the operating room where she was administered general anesthetic without complications.  Prepped and draped in usual sterile fashion.  Feet were placed in [place].  Trendelenburg position established.  Exam under anesthesia revealed a bulky mid position, 10-week size uterus without adnexal masses appreciated.  Cervix was grasped using a single- tooth tenaculum.  Dilute paracervical block with 0.25% Nesacaine placed in a standard fashion, 20 mL total.  Cervix easily dilated up to a #27 Pratt dilator.  A 9 mm suction curette placed.  Aspiration of products of conception was done, aspirated without difficulty.  Repeat suction and blunt curettage in a 4-quadrant method reveals the cavity to be empty.  Good hemostasis noted.  All instruments were removed.  The patient tolerated the procedure well, was awakened, and transferred to recovery in good condition.     Lovenia Kim, M.D.     RJT/MEDQ  D:  09/11/2016  T:  09/11/2016  Job:  [id number]  cc:   Lovenia Kim, M.D. Fax: [phone]

## 2016-09-11 NOTE — Progress Notes (Signed)
Patient seen and examined. Consent witnessed and signed. No changes noted. Update completed.Patient ID: Michelle Bartlett, female   DOB: Jul 16, 1975, 41 y.o.   MRN: [id number]

## 2016-09-12 ENCOUNTER — Encounter (HOSPITAL_COMMUNITY): Payer: Self-pay | Admitting: Obstetrics and Gynecology

## 2016-09-12 LAB — RH IG WORKUP (INCLUDES ABO/RH)
ABO/RH(D): A NEG
Antibody Screen: POSITIVE
DAT, IGG: NEGATIVE
GESTATIONAL AGE(WKS): 10
UNIT DIVISION: 0

## 2017-02-17 HISTORY — PX: BREAST ENHANCEMENT SURGERY: SHX7

## 2017-03-17 DIAGNOSIS — Z6821 Body mass index (BMI) 21.0-21.9, adult: Secondary | ICD-10-CM | POA: Diagnosis not present

## 2017-03-17 DIAGNOSIS — F909 Attention-deficit hyperactivity disorder, unspecified type: Secondary | ICD-10-CM | POA: Diagnosis not present

## 2017-04-06 DIAGNOSIS — F411 Generalized anxiety disorder: Secondary | ICD-10-CM | POA: Diagnosis not present

## 2017-04-13 DIAGNOSIS — F411 Generalized anxiety disorder: Secondary | ICD-10-CM | POA: Diagnosis not present

## 2017-04-23 DIAGNOSIS — F411 Generalized anxiety disorder: Secondary | ICD-10-CM | POA: Diagnosis not present

## 2017-06-09 DIAGNOSIS — F909 Attention-deficit hyperactivity disorder, unspecified type: Secondary | ICD-10-CM | POA: Diagnosis not present

## 2017-06-09 DIAGNOSIS — Z6820 Body mass index (BMI) 20.0-20.9, adult: Secondary | ICD-10-CM | POA: Diagnosis not present

## 2017-07-21 DIAGNOSIS — R300 Dysuria: Secondary | ICD-10-CM | POA: Diagnosis not present

## 2017-08-01 DIAGNOSIS — N3001 Acute cystitis with hematuria: Secondary | ICD-10-CM | POA: Diagnosis not present

## 2017-08-10 DIAGNOSIS — F331 Major depressive disorder, recurrent, moderate: Secondary | ICD-10-CM | POA: Diagnosis not present

## 2017-08-10 DIAGNOSIS — F419 Anxiety disorder, unspecified: Secondary | ICD-10-CM | POA: Diagnosis not present

## 2017-08-10 DIAGNOSIS — F909 Attention-deficit hyperactivity disorder, unspecified type: Secondary | ICD-10-CM | POA: Diagnosis not present

## 2017-11-02 DIAGNOSIS — F331 Major depressive disorder, recurrent, moderate: Secondary | ICD-10-CM | POA: Diagnosis not present

## 2017-11-02 DIAGNOSIS — F411 Generalized anxiety disorder: Secondary | ICD-10-CM | POA: Diagnosis not present

## 2017-11-02 DIAGNOSIS — Z681 Body mass index (BMI) 19 or less, adult: Secondary | ICD-10-CM | POA: Diagnosis not present

## 2017-11-02 DIAGNOSIS — N390 Urinary tract infection, site not specified: Secondary | ICD-10-CM | POA: Diagnosis not present

## 2017-11-02 DIAGNOSIS — R350 Frequency of micturition: Secondary | ICD-10-CM | POA: Diagnosis not present

## 2017-11-10 DIAGNOSIS — R300 Dysuria: Secondary | ICD-10-CM | POA: Diagnosis not present

## 2017-11-10 DIAGNOSIS — N760 Acute vaginitis: Secondary | ICD-10-CM | POA: Diagnosis not present

## 2017-11-10 DIAGNOSIS — Z6822 Body mass index (BMI) 22.0-22.9, adult: Secondary | ICD-10-CM | POA: Diagnosis not present

## 2017-11-10 DIAGNOSIS — F900 Attention-deficit hyperactivity disorder, predominantly inattentive type: Secondary | ICD-10-CM | POA: Diagnosis not present

## 2017-11-10 DIAGNOSIS — N390 Urinary tract infection, site not specified: Secondary | ICD-10-CM | POA: Diagnosis not present

## 2017-11-10 DIAGNOSIS — Z113 Encounter for screening for infections with a predominantly sexual mode of transmission: Secondary | ICD-10-CM | POA: Diagnosis not present

## 2018-01-26 DIAGNOSIS — F909 Attention-deficit hyperactivity disorder, unspecified type: Secondary | ICD-10-CM | POA: Diagnosis not present

## 2018-01-26 DIAGNOSIS — F331 Major depressive disorder, recurrent, moderate: Secondary | ICD-10-CM | POA: Diagnosis not present

## 2018-01-26 DIAGNOSIS — F419 Anxiety disorder, unspecified: Secondary | ICD-10-CM | POA: Diagnosis not present

## 2018-12-15 ENCOUNTER — Other Ambulatory Visit: Payer: Self-pay

## 2018-12-15 DIAGNOSIS — Z20822 Contact with and (suspected) exposure to covid-19: Secondary | ICD-10-CM

## 2018-12-16 LAB — NOVEL CORONAVIRUS, NAA: SARS-CoV-2, NAA: NOT DETECTED

## 2019-07-20 ENCOUNTER — Other Ambulatory Visit: Payer: Self-pay | Admitting: Obstetrics and Gynecology

## 2019-07-20 DIAGNOSIS — Z1231 Encounter for screening mammogram for malignant neoplasm of breast: Secondary | ICD-10-CM

## 2019-10-14 ENCOUNTER — Other Ambulatory Visit: Payer: Medicaid Other

## 2020-02-27 ENCOUNTER — Other Ambulatory Visit: Payer: Medicaid Other

## 2021-04-17 ENCOUNTER — Encounter: Payer: Self-pay | Admitting: Gastroenterology

## 2021-04-23 ENCOUNTER — Encounter: Payer: Self-pay | Admitting: Gastroenterology

## 2021-05-08 ENCOUNTER — Ambulatory Visit (INDEPENDENT_AMBULATORY_CARE_PROVIDER_SITE_OTHER): Payer: PRIVATE HEALTH INSURANCE | Admitting: Gastroenterology

## 2021-05-08 ENCOUNTER — Encounter: Payer: Self-pay | Admitting: Gastroenterology

## 2021-05-08 VITALS — BP 122/84 | HR 87 | Ht 69.0 in | Wt 154.2 lb

## 2021-05-08 DIAGNOSIS — R197 Diarrhea, unspecified: Secondary | ICD-10-CM

## 2021-05-08 DIAGNOSIS — K649 Unspecified hemorrhoids: Secondary | ICD-10-CM | POA: Diagnosis not present

## 2021-05-08 DIAGNOSIS — R1084 Generalized abdominal pain: Secondary | ICD-10-CM

## 2021-05-08 DIAGNOSIS — Z1211 Encounter for screening for malignant neoplasm of colon: Secondary | ICD-10-CM | POA: Diagnosis not present

## 2021-05-08 DIAGNOSIS — Z1212 Encounter for screening for malignant neoplasm of rectum: Secondary | ICD-10-CM

## 2021-05-08 MED ORDER — SUTAB 1479-225-188 MG PO TABS: 1.0000 | ORAL_TABLET | Freq: Once | ORAL | 0 refills | Status: AC

## 2021-05-08 NOTE — Patient Instructions (Signed)
If you are age 46 or older, your body mass index should be between 23-30. Your Body mass index is 22.77 kg/m?Marland Kitchen If this is out of the aforementioned range listed, please consider follow up with your Primary Care Provider. ? ?If you are age 80 or younger, your body mass index should be between 19-25. Your Body mass index is 22.77 kg/m?Marland Kitchen If this is out of the aformentioned range listed, please consider follow up with your Primary Care Provider. ? ?Your provider has requested that you go to the basement level for lab work before in 2 weeks . Press "B" on the elevator. The lab is located at the first door on the left as you exit the elevator.  ? ?You have been scheduled for a colonoscopy. Please follow written instructions given to you at your visit today.  ?Please pick up your prep supplies at the pharmacy within the next 1-3 days. ?If you use inhalers (even only as needed), please bring them with you on the day of your procedure.   ? ?The [facility] providers would like to encourage you to use [facility] to communicate with providers for non-urgent requests or questions.  Due to long hold times on the telephone, sending your provider a message by [facility] may be a faster and more efficient way to get a response.  Please allow 48 business hours for a response.  Please remember that this is for non-urgent requests.  ? ?It was a pleasure to see you today! ? ?Thank you for trusting me with your gastrointestinal care!   ? ?Scott E.Candis Schatz, MD  ? ?

## 2021-05-08 NOTE — Progress Notes (Signed)
? ?HPI : Michelle Bartlett is a very pleasant 46 year old female with history of ADHD who is referred to [place] by Dr. Brien Few for further evaluation of multiple chronic GI symptoms.  Of note, I have no prior records to review.  She tells me she was diagnosed with gluten sensitivity based on lab tests, but does not think she has had a confirmatory upper endoscopy.  She also tells me that she had blood work 4 weeks ago which showed anemia, very low iron levels, low B12 levels and low vitamin D levels.  She was started on oral iron supplements and started on a gluten-free diet at that time. ?Patient states that she has had problems with her GI tract for many years.  She reports issues with diarrhea as well as constipation.  She frequently passes mucus with her stools.  Her diarrhea could be severe for several days, then she will have problems with no bowel movements for several days.  Sometimes she has significant urgency but not always.  No nocturnal bowel movements.  No incontinence.  She started having rectal bleeding within the past year.  Rectal bleeding described as bright red blood per rectum off and on, more on the toilet paper and dripping from the anus.  She has a history of vomiting, more often in the evenings.  She denies symptoms of heartburn or acid regurgitation.  Bloating is a constant problem for her.  She started having frequent hard stools since she started taking the iron supplement. ? ?She reports multiple other non-GI symptoms such as frequent headaches, vision changes, night sweats, severe fatigue, restless leg symptoms and muscle cramps ?She reports a remote history of a gastric ulcer in high school causing her to be hospitalized for several days. ?She reports a history which sounds consistent with thrombosed external hemorrhoids which required lancing. ? ?Since starting the iron supplements and starting a gluten-free diet she has felt a lot better.  Her energy has increased.  Her diarrhea has  decreased significantly as has her hemorrhoidal bleeding.  Her headaches have also improved as has her leg cramps and restless leg symptoms. ? ?She reports being diagnosed with gluten sensitivity many years ago and for a while was on a gluten-free diet, but subsequently resumed eating gluten.  Again she does not recall having an upper endoscopy with biopsies to confirm a diagnosis of celiac disease. ?She denies any family history of celiac disease.  Her mother has IBS and she has an aunt with Crohn's disease. ? ?She has never had a colonoscopy. ? ?She underwent a very stressful and prolonged divorce in 2015 and she thinks that the stress associated with this has contributed to her GI symptoms. ? ?Past Medical History:  ?Diagnosis Date  ? Attention deficit disorder with hyperactivity(314.01)   ? Complication of anesthesia   ? spinal headache with epidural even in labor with second child  ? Decreased fetal movements, affecting management of mother, unspecified as to episode of care in pregnancy   ? Gastric ulcer, unspecified as acute or chronic, without mention of hemorrhage, perforation, or obstruction   ? GERD (gastroesophageal reflux disease)   ? H/O varicella   ? Headache(784.0)   ? Other specified complication of pregnancy, unspecified as to episode of care   ? back pain  ? Postpartum care following vaginal delivery F (02/04/12) 02/05/2012  ? Threatened premature labor, antepartum(644.03)   ? ? ? ?Past Surgical History:  ?Procedure Laterality Date  ? DILATION AND CURETTAGE OF UTERUS  2012  ? DILATION AND EVACUATION N/A 09/11/2016  ? Procedure: DILATATION AND EVACUATION;  Surgeon: Brien Few, MD;  Location: [facility];  Service: Gynecology;  Laterality: N/A;  ? ?Family History  ?Problem Relation Age of Onset  ? Cancer Mother   ?     breast  ? Urolithiasis Father   ? Hypertension Father   ? Depression Maternal Grandmother   ? Heart attack Maternal Grandfather   ? Stroke Maternal Grandfather   ? Cancer Maternal  Grandfather   ?     prostate  ? Diabetes Maternal Grandfather   ? Cancer Paternal Grandmother   ?     breast  ? Hypertension Paternal Grandmother   ? Heart attack Paternal Grandfather   ? Hypertension Paternal Grandfather   ? ?Social History  ? ?Tobacco Use  ? Smoking status: Never  ? Smokeless tobacco: Never  ?Substance Use Topics  ? Alcohol use: No  ? Drug use: No  ? ?No current outpatient medications on file.  ? ?No current facility-administered medications for this visit.  ? ?Allergies  ?Allergen Reactions  ? Codeine Nausea And Vomiting  ? ? ? ?Review of Systems: ?All systems reviewed and negative except where noted in HPI.  ? ? ?No results found. ? ?Physical Exam: ?Ht 5' 9"  (1.753 m)   Wt 154 lb 3.2 oz (69.9 kg)   BMI 22.77 kg/m?  ?Constitutional: Pleasant,well-developed, Caucasian female in no acute distress. ?HEENT: Normocephalic and atraumatic. Conjunctivae are normal. No scleral icterus. ?Neck supple.  ?Cardiovascular: Normal rate, regular rhythm.  ?Pulmonary/chest: Effort normal and breath sounds normal. No wheezing, rales or rhonchi. ?Abdominal: Soft, nondistended, tenderness to palpation in bilateral lower quadrants. Bowel sounds active throughout. There are no masses palpable. No hepatomegaly. ?Extremities: no edema ?Neurological: Alert and oriented to person place and time. ?Skin: Skin is warm and dry. No rashes noted. ?Psychiatric: Normal mood and affect. Behavior is normal. ? ?CBC ?   ?Component Value Date/Time  ? WBC 7.6 09/11/2016 1215  ? RBC 3.76 (L) 09/11/2016 1215  ? HGB 11.6 (L) 09/11/2016 1215  ? HCT 34.9 (L) 09/11/2016 1215  ? PLT 287 09/11/2016 1215  ? MCV 92.8 09/11/2016 1215  ? MCH 30.9 09/11/2016 1215  ? MCHC 33.2 09/11/2016 1215  ? RDW 15.1 09/11/2016 1215  ? ? ?CMP  ?No results found for: NA, K, CL, CO2, GLUCOSE, BUN, CREATININE, CALCIUM, PROT, ALBUMIN, AST, ALT, ALKPHOS, BILITOT, GFRNONAA, GFRAA ? ? ?ASSESSMENT AND PLAN: ?46 year old female with multiple chronic GI symptoms.   Accurate and consistent history is a little difficult to obtain especially in the absence of objective data, but it sounds like she has had positive celiac serologies at least twice in the past but not never had an upper endoscopy to confirm celiac disease.  It appears she has a positive response to a gluten-free diet.  She has a history of recent multiple vitamin deficiencies and iron deficiency.  Unclear if patient has mixed IBS versus celiac disease at this point.  She has been on a gluten-free diet for over a month now.  I suggest that she resume a regular diet for 2 weeks and we will repeat celiac serologies.  We will also repeat iron panel, B12/folate and vitamin D levels as well as CBC and CMP.  Given her reported vision changes, will check vitamin A.  We will check H. pylori serology. ?The patient is due for her initial average risk screening colonoscopy.  We will schedule her for this.  If  her celiac serologies are positive, we will also need to do an upper endoscopy.  At that time we will assess for the presence of internal/external hemorrhoids ?Plan a follow-up visit in 2 to 4 weeks after her lab testing colonoscopy are complete to further discuss management of her chronic symptoms including her hemorrhoids. ? ?IBS versus celiac disease ?- Resume regular diet with daily consumption of gluten-containing foods for the next 2 weeks ?- Following this to be a challenge, check celiac serologies (TTG, endomysial, DGP), CBC, CMP, iron panel, B12/folate panel, vitamin D, vitamin A, CRP, H. pylori serology ?- EGD if celiac serologies positive ? ?Colon cancer screening ?- Colonoscopy ? ?Hemorrhoids, history more suggestive of external hemorrhoids ?- We will discuss optimal management of bowel habits once we determine if she has celiac disease versus IBS ? ? ?The details, risks (including bleeding, perforation, infection, missed lesions, medication reactions and possible hospitalization or surgery if complications  occur), benefits, and alternatives to colonoscopy with possible biopsy and possible polypectomy were discussed with the patient and she consents to proceed.  ? ? ?Thoma Paulsen E. Candis Schatz, MD ?[facility] ?

## 2021-05-13 ENCOUNTER — Encounter: Payer: Self-pay | Admitting: Gastroenterology

## 2021-05-13 ENCOUNTER — Telehealth: Payer: Self-pay | Admitting: Gastroenterology

## 2021-05-13 ENCOUNTER — Ambulatory Visit (AMBULATORY_SURGERY_CENTER): Payer: PRIVATE HEALTH INSURANCE | Admitting: Gastroenterology

## 2021-05-13 VITALS — BP 113/74 | HR 68 | Temp 98.9°F | Resp 12 | Ht 69.0 in | Wt 154.0 lb

## 2021-05-13 DIAGNOSIS — D125 Benign neoplasm of sigmoid colon: Secondary | ICD-10-CM

## 2021-05-13 DIAGNOSIS — Z1211 Encounter for screening for malignant neoplasm of colon: Secondary | ICD-10-CM | POA: Diagnosis not present

## 2021-05-13 DIAGNOSIS — K649 Unspecified hemorrhoids: Secondary | ICD-10-CM

## 2021-05-13 DIAGNOSIS — R197 Diarrhea, unspecified: Secondary | ICD-10-CM

## 2021-05-13 DIAGNOSIS — Z800 Family history of malignant neoplasm of digestive organs: Secondary | ICD-10-CM

## 2021-05-13 DIAGNOSIS — R1084 Generalized abdominal pain: Secondary | ICD-10-CM

## 2021-05-13 MED ORDER — SODIUM CHLORIDE 0.9 % IV SOLN: 500.0000 mL | Freq: Once | INTRAVENOUS | Status: DC

## 2021-05-13 NOTE — Telephone Encounter (Signed)
Patient called concerned about constipation. Patient has colonoscopy scheduled for today. Please advise ?

## 2021-05-13 NOTE — Telephone Encounter (Signed)
Returned pt's call.  Spoke with pt.  Pt said that she has started to have bowel movements and that she is fine now.  Pt will arrive for colonoscopy at scheduled time for check-in.   ? ? ? ?

## 2021-05-13 NOTE — Progress Notes (Signed)
Sedate, gd SR, tolerated procedure well, VSS, report to RN 

## 2021-05-13 NOTE — Op Note (Signed)
[place] ?Patient Name: Michelle Bartlett ?Procedure Date: 05/13/2021 4:45 PM ?MRN: [id number] ?Endoscopist: Alfard Cochrane E. Candis Schatz , MD ?Age: 46 ?Referring MD:  ?Date of Birth: 11-28-1975 ?Gender: Female ?Account #: [id number] ?Procedure:                Colonoscopy ?Indications:              Screening for colon cancer: Family history of  ?                          colorectal cancer in distant relative(s) 70 or older ?Medicines:                Monitored Anesthesia Care ?Procedure:                Pre-Anesthesia Assessment: ?                          - Prior to the procedure, a History and Physical  ?                          was performed, and patient medications and  ?                          allergies were reviewed. The patient's tolerance of  ?                          previous anesthesia was also reviewed. The risks  ?                          and benefits of the procedure and the sedation  ?                          options and risks were discussed with the patient.  ?                          All questions were answered, and informed consent  ?                          was obtained. Prior Anticoagulants: The patient has  ?                          taken no previous anticoagulant or antiplatelet  ?                          agents. ASA Grade Assessment: II - A patient with  ?                          mild systemic disease. After reviewing the risks  ?                          and benefits, the patient was deemed in  ?                          satisfactory condition to undergo the procedure. ?  After obtaining informed consent, the colonoscope  ?                          was passed under direct vision. Throughout the  ?                          procedure, the patient's blood pressure, pulse, and  ?                          oxygen saturations were monitored continuously. The  ?                          PCF-HQ190L Colonoscope was introduced through the  ?                          anus  and advanced to the the terminal ileum, with  ?                          identification of the appendiceal orifice and IC  ?                          valve. The colonoscopy was somewhat difficult due  ?                          to a tortuous colon. The patient tolerated the  ?                          procedure well. The quality of the bowel  ?                          preparation was adequate. The terminal ileum,  ?                          ileocecal valve, appendiceal orifice, and rectum  ?                          were photographed. ?Scope In: 4:51:12 PM ?Scope Out: 5:08:57 PM ?Scope Withdrawal Time: 0 hours 10 minutes 38 seconds  ?Total Procedure Duration: 0 hours 17 minutes 45 seconds  ?Findings:                 Skin tags were found on perianal exam. ?                          A 10 mm polyp was found in the sigmoid colon. The  ?                          polyp was pedunculated. The polyp was removed with  ?                          a cold snare. Resection and retrieval were  ?                          complete. Estimated blood loss was minimal. ?  Multiple small-mouthed diverticula were found in  ?                          the sigmoid colon. ?                          The exam was otherwise normal throughout the  ?                          examined colon. ?                          The terminal ileum appeared normal. ?                          Non-bleeding internal hemorrhoids were found during  ?                          retroflexion. The hemorrhoids were large. ?                          No additional abnormalities were found on  ?                          retroflexion. ?Complications:            No immediate complications. ?Estimated Blood Loss:     Estimated blood loss was minimal. ?Impression:               - Perianal skin tags found on perianal exam. ?                          - One 10 mm polyp in the sigmoid colon, removed  ?                          with a cold snare. Resected and  retrieved. ?                          - Diverticulosis in the sigmoid colon. ?                          - The examined portion of the ileum was normal. ?                          - Non-bleeding internal hemorrhoids. ?Recommendation:           - Patient has a contact number available for  ?                          emergencies. The signs and symptoms of potential  ?                          delayed complications were discussed with the  ?                          patient. Return to normal activities tomorrow.  ?  Written discharge instructions were provided to the  ?                          patient. ?                          - Resume previous diet. ?                          - Continue present medications. ?                          - Await pathology results. ?                          - Repeat colonoscopy (date not yet determined) for  ?                          surveillance based on pathology results. ?                          - Recommend high fiber diet or daily fiber  ?                          supplementation to help with hemorrhoid symptoms  ?                          and reduce risk of complications from  ?                          diverticulosis. ?                          - Follow up with Dr. Candis Schatz in GI clinic. ?Ebrahim Deremer E. Candis Schatz, MD ?05/13/2021 5:16:50 PM ?This report has been signed electronically. ?

## 2021-05-13 NOTE — Progress Notes (Signed)
Called to room to assist during endoscopic procedure.  Patient ID and intended procedure confirmed with present staff. Received instructions for my participation in the procedure from the performing physician.  

## 2021-05-13 NOTE — Progress Notes (Deleted)
PT taken to PACU. Monitors in place. VSS. Report given to RN. 

## 2021-05-13 NOTE — Progress Notes (Signed)
History and Physical Interval Note: ? ?05/13/2021 ?4:39 PM ? ?Michelle Bartlett  has presented today for endoscopic procedure(s), with the diagnosis of  ?Encounter Diagnoses  ?Name Primary?  ? Diarrhea, unspecified type Yes  ? Hemorrhoids, unspecified hemorrhoid type   ? Generalized abdominal pain   ?Marland Kitchen  The various methods of evaluation and treatment have been discussed with the patient and/or family. After consideration of risks, benefits and other options for treatment, the patient has consented to  the endoscopic procedure(s). ? ? The patient's history has been reviewed, patient examined, no change in status, stable for endoscopic procedure(s).  I have reviewed the patient's chart and labs.  Questions were answered to the patient's satisfaction.   ? ? ?Brelee Renk E. Candis Schatz, MD ?[facility] ? ?

## 2021-05-13 NOTE — Patient Instructions (Signed)
Handout on polyps and high fiber diet given.  ? ? ?YOU HAD AN ENDOSCOPIC PROCEDURE TODAY AT [facility]:   Refer to the procedure report that was given to you for any specific questions about what was found during the examination.  If the procedure report does not answer your questions, please call your gastroenterologist to clarify.  If you requested that your care partner not be given the details of your procedure findings, then the procedure report has been included in a sealed envelope for you to review at your convenience later. ? ?YOU SHOULD EXPECT: Some feelings of bloating in the abdomen. Passage of more gas than usual.  Walking can help get rid of the air that was put into your GI tract during the procedure and reduce the bloating. If you had a lower endoscopy (such as a colonoscopy or flexible sigmoidoscopy) you may notice spotting of blood in your stool or on the toilet paper. If you underwent a bowel prep for your procedure, you may not have a normal bowel movement for a few days. ? ?Please Note:  You might notice some irritation and congestion in your nose or some drainage.  This is from the oxygen used during your procedure.  There is no need for concern and it should clear up in a day or so. ? ?SYMPTOMS TO REPORT IMMEDIATELY: ? ?Following lower endoscopy (colonoscopy or flexible sigmoidoscopy): ? Excessive amounts of blood in the stool ? Significant tenderness or worsening of abdominal pains ? Swelling of the abdomen that is new, acute ? Fever of 100?F or higher ? ? ?For urgent or emergent issues, a gastroenterologist can be reached at any hour by calling [phone]. ?Do not use MyChart messaging for urgent concerns.  ? ? ?DIET:  We do recommend a small meal at first, but then you may proceed to your regular diet.  Drink plenty of fluids but you should avoid alcoholic beverages for 24 hours. ? ?ACTIVITY:  You should plan to take it easy for the rest of today and you should NOT  DRIVE or use heavy machinery until tomorrow (because of the sedation medicines used during the test).   ? ?FOLLOW UP: ?Our staff will call the number listed on your records 48-72 hours following your procedure to check on you and address any questions or concerns that you may have regarding the information given to you following your procedure. If we do not reach you, we will leave a message.  We will attempt to reach you two times.  During this call, we will ask if you have developed any symptoms of COVID 19. If you develop any symptoms (ie: fever, flu-like symptoms, shortness of breath, cough etc.) before then, please call [phone].  If you test positive for Covid 19 in the 2 weeks post procedure, please call and report this information to [place].   ? ?If any biopsies were taken you will be contacted by phone or by letter within the next 1-3 weeks.  Please call us at [phone] if you have not heard about the biopsies in 3 weeks.  ? ? ?SIGNATURES/CONFIDENTIALITY: ?You and/or your care partner have signed paperwork which will be entered into your electronic medical record.  These signatures attest to the fact that that the information above on your After Visit Summary has been reviewed and is understood.  Full responsibility of the confidentiality of this discharge information lies with you and/or your care-partner.  ?

## 2021-05-15 ENCOUNTER — Telehealth: Payer: Self-pay

## 2021-05-15 NOTE — Telephone Encounter (Signed)
?  Follow up Call- ? ? ?  05/13/2021  ?  3:35 PM  ?Call back number  ?Post procedure Call Back phone  # [phone]  ?Permission to leave phone message Yes  ?  ? ?Patient questions: ? ?Do you have a fever, pain , or abdominal swelling? No. ?Pain Score  0 * ? ?Have you tolerated food without any problems? Yes.   ? ?Have you been able to return to your normal activities? Yes.   ? ?Do you have any questions about your discharge instructions: ?Diet   No. ?Medications  No. ?Follow up visit  No. ? ?Do you have questions or concerns about your Care? No. ? ?Actions: ?* If pain score is 4 or above: ?No action needed, pain <4. ? ? ?

## 2021-05-16 ENCOUNTER — Telehealth: Payer: Self-pay | Admitting: Gastroenterology

## 2021-05-16 NOTE — Telephone Encounter (Signed)
The pt has been advised that the path has not been reviewed.  She is aware she will be called as soon as available.  ?

## 2021-05-16 NOTE — Telephone Encounter (Signed)
Patient called to get path results. ?

## 2021-05-20 ENCOUNTER — Encounter: Payer: Self-pay | Admitting: Gastroenterology

## 2021-06-04 ENCOUNTER — Ambulatory Visit: Payer: Medicaid Other | Admitting: Gastroenterology

## 2021-06-05 ENCOUNTER — Ambulatory Visit (INDEPENDENT_AMBULATORY_CARE_PROVIDER_SITE_OTHER): Payer: PRIVATE HEALTH INSURANCE | Admitting: Gastroenterology

## 2021-06-05 ENCOUNTER — Other Ambulatory Visit (INDEPENDENT_AMBULATORY_CARE_PROVIDER_SITE_OTHER): Payer: PRIVATE HEALTH INSURANCE

## 2021-06-05 ENCOUNTER — Encounter: Payer: Self-pay | Admitting: Gastroenterology

## 2021-06-05 VITALS — BP 102/66 | HR 62 | Ht 69.0 in | Wt 156.0 lb

## 2021-06-05 DIAGNOSIS — Z1211 Encounter for screening for malignant neoplasm of colon: Secondary | ICD-10-CM | POA: Diagnosis not present

## 2021-06-05 DIAGNOSIS — R197 Diarrhea, unspecified: Secondary | ICD-10-CM | POA: Diagnosis not present

## 2021-06-05 DIAGNOSIS — R140 Abdominal distension (gaseous): Secondary | ICD-10-CM | POA: Diagnosis not present

## 2021-06-05 DIAGNOSIS — K649 Unspecified hemorrhoids: Secondary | ICD-10-CM | POA: Diagnosis not present

## 2021-06-05 DIAGNOSIS — K635 Polyp of colon: Secondary | ICD-10-CM

## 2021-06-05 DIAGNOSIS — Z1212 Encounter for screening for malignant neoplasm of rectum: Secondary | ICD-10-CM

## 2021-06-05 DIAGNOSIS — R1084 Generalized abdominal pain: Secondary | ICD-10-CM

## 2021-06-05 LAB — COMPREHENSIVE METABOLIC PANEL
ALT: 19 U/L (ref 0–35)
AST: 19 U/L (ref 0–37)
Albumin: 4.5 g/dL (ref 3.5–5.2)
Alkaline Phosphatase: 36 U/L — ABNORMAL LOW (ref 39–117)
BUN: 11 mg/dL (ref 6–23)
CO2: 27 mEq/L (ref 19–32)
Calcium: 9.2 mg/dL (ref 8.4–10.5)
Chloride: 103 mEq/L (ref 96–112)
Creatinine, Ser: 0.67 mg/dL (ref 0.40–1.20)
GFR: 105.52 mL/min (ref >60.00)
Glucose, Bld: 99 mg/dL (ref 70–99)
Potassium: 4.4 mEq/L (ref 3.5–5.1)
Sodium: 137 mEq/L (ref 135–145)
Total Bilirubin: 0.3 mg/dL (ref 0.2–1.2)
Total Protein: 6.7 g/dL (ref 6.0–8.3)

## 2021-06-05 LAB — IBC + FERRITIN
Ferritin: 10.1 ng/mL (ref 10.0–291.0)
Iron: 83 ug/dL (ref 42–145)
Saturation Ratios: 20.6 % (ref 20.0–50.0)
TIBC: 403.2 ug/dL (ref 250.0–450.0)
Transferrin: 288 mg/dL (ref 212.0–360.0)

## 2021-06-05 LAB — CBC WITH DIFFERENTIAL/PLATELET
Basophils Absolute: 0 10*3/uL (ref 0.0–0.1)
Basophils Relative: 0.6 % (ref 0.0–3.0)
Eosinophils Absolute: 0.1 10*3/uL (ref 0.0–0.7)
Eosinophils Relative: 2.6 % (ref 0.0–5.0)
HCT: 34.1 % — ABNORMAL LOW (ref 36.0–46.0)
Hemoglobin: 11.6 g/dL — ABNORMAL LOW (ref 12.0–15.0)
Lymphocytes Relative: 24.5 % (ref 12.0–46.0)
Lymphs Abs: 1.3 10*3/uL (ref 0.7–4.0)
MCHC: 34.1 g/dL (ref 30.0–36.0)
MCV: 96.7 fl (ref 78.0–100.0)
Monocytes Absolute: 0.4 10*3/uL (ref 0.1–1.0)
Monocytes Relative: 8 % (ref 3.0–12.0)
Neutro Abs: 3.4 10*3/uL (ref 1.4–7.7)
Neutrophils Relative %: 64.3 % (ref 43.0–77.0)
Platelets: 268 10*3/uL (ref 150.0–400.0)
RBC: 3.53 Mil/uL — ABNORMAL LOW (ref 3.87–5.11)
RDW: 13.9 % (ref 11.5–15.5)
WBC: 5.2 10*3/uL (ref 4.0–10.5)

## 2021-06-05 LAB — H. PYLORI ANTIBODY, IGG: H Pylori IgG: NEGATIVE

## 2021-06-05 LAB — C-REACTIVE PROTEIN: CRP: <1 mg/dL (ref 0.5–20.0)

## 2021-06-05 LAB — VITAMIN D 25 HYDROXY (VIT D DEFICIENCY, FRACTURES): VITD: 30.45 ng/mL (ref 30.00–100.00)

## 2021-06-05 LAB — VITAMIN B12: Vitamin B-12: 230 pg/mL (ref 211–911)

## 2021-06-05 LAB — FOLATE: Folate: 22.4 ng/mL (ref >5.9)

## 2021-06-05 LAB — PROTIME-INR
INR: 1 ratio (ref 0.8–1.0)
Prothrombin Time: 10.9 s (ref 9.6–13.1)

## 2021-06-05 NOTE — Patient Instructions (Signed)
If you are age 46 or older, your body mass index should be between 23-30. Your Body mass index is 23.04 kg/m?Marland Kitchen If this is out of the aforementioned range listed, please consider follow up with your Primary Care Provider. ? ?If you are age 48 or younger, your body mass index should be between 19-25. Your Body mass index is 23.04 kg/m?Marland Kitchen If this is out of the aformentioned range listed, please consider follow up with your Primary Care Provider.  ? ?Your provider has requested that you go to the basement level for lab work before leaving today. Press "B" on the elevator. The lab is located at the first door on the left as you exit the elevator. ? ?The [facility] providers would like to encourage you to use [facility] to communicate with providers for non-urgent requests or questions.  Due to long hold times on the telephone, sending your provider a message by [facility] may be a faster and more efficient way to get a response.  Please allow 48 business hours for a response.  Please remember that this is for non-urgent requests.  ? ?Due to recent changes in healthcare laws, you may see the results of your imaging and laboratory studies on MyChart before your provider has had a chance to review them.  We understand that in some cases there may be results that are confusing or concerning to you. Not all laboratory results come back in the same time frame and the provider may be waiting for multiple results in order to interpret others.  Please give [place] 48 hours in order for your provider to thoroughly review all the results before contacting the office for clarification of your results.  ? ?It was a pleasure to see you today! ? ?Thank you for trusting me with your gastrointestinal care!   ? ?Scott E.Candis Schatz, MD  ? ? ?

## 2021-06-05 NOTE — Progress Notes (Signed)
? ?HPI : Michelle Bartlett is a very pleasant 46 year old female who I saw in clinic initially on March 22 with numerous  chronic GI symptoms to include abdominal pain, diarrhea and constipation, hematochezia, bloating.  She had a history of gluten sensitivity and a questionable  diagnosis of celiac disease.  She underwent colonoscopy on March 27 which was notable for a single small tubular adenoma, Diverticulosis and internal and external hemorrhoids.  Because she had been on a gluten-free diet for a prolonged period, it is recommended that she attempt a gluten challenge and then check celiac serologies.  Patient states she was able to eat gluten for a week, but had to stop because she felt so poorly.  Her symptoms of diarrhea, bloating and fatigue became  severe even after a few days of eating gluten, she felt she must not.  She resumed a gluten-free diet about a week ago, and already feels a lot better.  She has minimal abdominal pain currently and has been having regular bowel movements.  Her fatigue is also improved. ?She has been taking ferrous sulfate twice daily since January, and is curious as to if her iron levels are normal. ?She has also been taking ergocalciferol once a week since February and is wondering if her vitamin D levels have normalized. ? ? ?Past Medical History:  ?Diagnosis Date  ? Anxiety   ? Attention deficit disorder with hyperactivity(314.01)   ? Celiac disease   ? Complication of anesthesia   ? spinal headache with epidural even in labor with second child  ? Decreased fetal movements, affecting management of mother, unspecified as to episode of care in pregnancy   ? Depression   ? Gastric ulcer, unspecified as acute or chronic, without mention of hemorrhage, perforation, or obstruction   ? GERD (gastroesophageal reflux disease)   ? H/O varicella   ? Headache(784.0)   ? IBS (irritable bowel syndrome)   ? Iron deficiency anemia   ? Other specified complication of pregnancy, unspecified as to  episode of care   ? back pain  ? Postpartum care following vaginal delivery F (02/04/12) 02/05/2012  ? Threatened premature labor, antepartum(644.03)   ? Vitamin D deficiency   ? ? ? ?Past Surgical History:  ?Procedure Laterality Date  ? DILATION AND CURETTAGE OF UTERUS  2012  ? DILATION AND EVACUATION N/A 09/11/2016  ? Procedure: DILATATION AND EVACUATION;  Surgeon: Brien Few, MD;  Location: [facility];  Service: Gynecology;  Laterality: N/A;  ? ?Family History  ?Problem Relation Age of Onset  ? Cancer Mother   ?     breast  ? Irritable bowel syndrome Mother   ? Urolithiasis Father   ? Hypertension Father   ? Depression Maternal Grandmother   ? Heart attack Maternal Grandfather   ? Stroke Maternal Grandfather   ? Cancer Maternal Grandfather   ?     prostate  ? Diabetes Maternal Grandfather   ? Cancer Paternal Grandmother   ?     breast  ? Hypertension Paternal Grandmother   ? Heart attack Paternal Grandfather   ? Hypertension Paternal Grandfather   ? Crohn's disease Maternal Aunt   ? Stomach cancer Neg Hx   ? Esophageal cancer Neg Hx   ? Pancreatic cancer Neg Hx   ? Colon polyps Neg Hx   ? Colon cancer Neg Hx   ? ?Social History  ? ?Tobacco Use  ? Smoking status: Former  ?  Types: Cigarettes  ? Smokeless tobacco: Never  ?Vaping  Use  ? Vaping Use: Never used  ?Substance Use Topics  ? Alcohol use: Yes  ?  Comment: Occasionally wine  ? Drug use: No  ? ?Current Outpatient Medications  ?Medication Sig Dispense Refill  ? cyclobenzaprine (FLEXERIL) 10 MG tablet Take 10 mg by mouth 3 (three) times daily as needed.    ? ondansetron (ZOFRAN) 4 MG tablet Take 4 mg by mouth every 8 (eight) hours as needed.    ? ?No current facility-administered medications for this visit.  ? ?Allergies  ?Allergen Reactions  ? Codeine Nausea And Vomiting  ? ? ? ?Review of Systems: ?All systems reviewed and negative except where noted in HPI.  ? ? ?No results found. ? ?Physical Exam: ?BP 102/66   Pulse 62   Ht _0  (1.753 m)   Wt 156 lb  (70.8 kg)   BMI 23.04 kg/m?  ?Constitutional: Pleasant,well-developed,   Caucasianfemale in no acute distress. ?HEENT: Normocephalic and atraumatic. Conjunctivae are normal. No scleral icterus. ?Neurological: Alert and oriented to person place and time. ?Skin: Skin is warm and dry. No rashes noted. ?Psychiatric: Normal mood and affect. Behavior is normal. ? ?CBC ?   ?Component Value Date/Time  ? WBC 7.6 09/11/2016 1215  ? RBC 3.76 (L) 09/11/2016 1215  ? HGB 11.6 (L) 09/11/2016 1215  ? HCT 34.9 (L) 09/11/2016 1215  ? PLT 287 09/11/2016 1215  ? MCV 92.8 09/11/2016 1215  ? MCH 30.9 09/11/2016 1215  ? MCHC 33.2 09/11/2016 1215  ? RDW 15.1 09/11/2016 1215  ? ? ?CMP  ?No results found for: NA, K, CL, CO2, GLUCOSE, BUN, CREATININE, CALCIUM, PROT, ALBUMIN, AST, ALT, ALKPHOS, BILITOT, GFRNONAA, GFRAA ? ? ?ASSESSMENT AND PLAN: ? 46 year old female with chronic symptoms of abdominal pain, diarrhea and bloating, significantly improved on a gluten-free diet, with unclear diagnosis of celiac disease in the past  Previously, would plan to do a 2-week gluten challenge and obtain celiac serologies, but the patient was only able to resume gluten for about a week before going back on a gluten-free diet.  Patient felt horrible during the gluten challenge and feels much better on the gluten-free diet.    It is clear that the patient should be maintained on a gluten-free diet given the clinical improvement, but it is not clear if she has actual celiac disease versus nonceliac gluten sensitivity.  We will  obtain celiac serologies anyway, even though negative results may not be accurate.  If her serologies are negative, I recommend we obtain HLA haplotype testing, which could definitively rule out celiac disease (but would not confirm a diagnosis).  If the serologies are positive, I would recommend we proceed with an upper endoscopy with duodenal biopsies. ? ?We will obtain all the other previous labs mentioned at her initial clinic  visit ? ? IBS versus celiac disease ?- Celiac panel ?- Iron panel ?- Vitamin D, B12/folate, vitamin A, INR ?-  H. pylori serology ?- CRP ?- HLA haplotype testing if celiac panel negative ?- Continue gluten-free diet  ? ?Colon polyp (10 mm tubular adenoma) ?- Patient will be due for surveillance colonoscopy in 3 years ? ?Markita Stcharles E. Candis Schatz, MD ?[facility] ? ?CC:  Brien Few, MD ? ?

## 2021-06-08 LAB — CELIAC PANEL 10
Antigliadin Abs, IgA: 4 units (ref 0–19)
Endomysial IgA: NEGATIVE
Gliadin IgG: 2 units (ref 0–19)
IgA/Immunoglobulin A, Serum: 93 mg/dL (ref 87–352)
Tissue Transglut Ab: <2 U/mL (ref 0–5)
Transglutaminase IgA: <2 U/mL (ref 0–3)

## 2021-06-09 LAB — VITAMIN A: Vitamin A (Retinoic Acid): 65 ug/dL (ref 38–98)

## 2021-06-10 NOTE — Progress Notes (Signed)
Niambi,  ?Your labs looked good.  All your vitamin levels were within normal limits although you are still slightly anemic with borderline iron levels.  I would continue taking the iron supplement. ?All of the celiac antibody tests were negative.  As discussed, we can get a genetic test which can rule out celiac disease if negative, but cannot diagnose celiac disease.  Please go back to the lab in our building at your convenience to give another blood sample for the genetic test. ?

## 2022-01-31 ENCOUNTER — Other Ambulatory Visit: Payer: Self-pay | Admitting: Orthopaedic Surgery

## 2022-01-31 DIAGNOSIS — M79671 Pain in right foot: Secondary | ICD-10-CM

## 2022-02-12 ENCOUNTER — Other Ambulatory Visit: Payer: PRIVATE HEALTH INSURANCE

## 2022-02-21 ENCOUNTER — Ambulatory Visit
Admission: RE | Admit: 2022-02-21 | Discharge: 2022-02-21 | Disposition: A | Discharge status: Home / Self Care | Payer: PRIVATE HEALTH INSURANCE | Source: Ambulatory Visit | Attending: Orthopaedic Surgery

## 2022-02-21 DIAGNOSIS — M79671 Pain in right foot: Secondary | ICD-10-CM

## 2022-05-07 ENCOUNTER — Other Ambulatory Visit: Payer: Self-pay | Admitting: Orthopaedic Surgery

## 2022-05-15 NOTE — Pre-Procedure Instructions (Signed)
Surgical Instructions    Your procedure is scheduled on Tuesday, April 2nd.  Report to [facility] Main Entrance "A" at 05:30 A.M., then check in with the Admitting office.  Call this number if you have problems the morning of surgery:  [phone]  If you have any questions prior to your surgery date call [phone]: Open Monday-Friday 8am-4pm If you experience any cold or flu symptoms such as cough, fever, chills, shortness of breath, etc. between now and your scheduled surgery, please notify us at the above number.     Remember:  Do not eat after midnight the night before your surgery  You may drink clear liquids until 04:30 AM the morning of your surgery.   Clear liquids allowed are: Water, Non-Citrus Juices (without pulp), Carbonated Beverages, Clear Tea, Black Coffee Only (NO MILK, CREAM OR POWDERED CREAMER of any kind), and Gatorade.   Patient Instructions  The night before surgery:  No food after midnight. ONLY clear liquids after midnight  The day of surgery (if you do NOT have diabetes):  Drink ONE (1) Pre-Surgery Clear Ensure by 04:30 AM the morning of surgery. Drink in one sitting. Do not sip.  This drink was given to you during your hospital  pre-op appointment visit.  Nothing else to drink after completing the  Pre-Surgery Clear Ensure.          If you have questions, please contact your surgeon's office.     Take these medicines the morning of surgery with A SIP OF WATER  If needed: cyclobenzaprine (FLEXERIL)  ondansetron (ZOFRAN)   As of today, STOP taking any Aspirin (unless otherwise instructed by your surgeon) Aleve, Naproxen, Ibuprofen, Motrin, Advil, Goody's, BC's, all herbal medications, fish oil, and all vitamins.                     Do NOT Smoke (Tobacco/Vaping) for 24 hours prior to your procedure.  If you use a CPAP at night, you may bring your mask/headgear for your overnight stay.   Contacts, glasses, piercing's, hearing aid's, dentures or  partials may not be worn into surgery, please bring cases for these belongings.    For patients admitted to the hospital, discharge time will be determined by your treatment team.   Patients discharged the day of surgery will not be allowed to drive home, and someone needs to stay with them for 24 hours.  SURGICAL WAITING ROOM VISITATION Patients having surgery or a procedure may have no more than 2 support people in the waiting area - these visitors may rotate.   Children under the age of 52 must have an adult with them who is not the patient. If the patient needs to stay at the hospital during part of their recovery, the visitor guidelines for inpatient rooms apply. Pre-op nurse will coordinate an appropriate time for 1 support person to accompany patient in pre-op.  This support person may not rotate.   Please refer to the [facility] website for the visitor guidelines for Inpatients (after your surgery is over and you are in a regular room).    Special instructions:   [place]- Preparing For Surgery  Before surgery, you can play an important role. Because skin is not sterile, your skin needs to be as free of germs as possible. You can reduce the number of germs on your skin by washing with CHG (chlorahexidine gluconate) Soap before surgery.  CHG is an antiseptic cleaner which kills germs and bonds with the skin  to continue killing germs even after washing.    Oral Hygiene is also important to reduce your risk of infection.  Remember - BRUSH YOUR TEETH THE MORNING OF SURGERY WITH YOUR REGULAR TOOTHPASTE  Please do not use if you have an allergy to CHG or antibacterial soaps. If your skin becomes reddened/irritated stop using the CHG.  Do not shave (including legs and underarms) for at least 48 hours prior to first CHG shower. It is OK to shave your face.  Please follow these instructions carefully.   Shower the NIGHT BEFORE SURGERY and the MORNING OF SURGERY  If you chose to wash  your hair, wash your hair first as usual with your normal shampoo.  After you shampoo, rinse your hair and body thoroughly to remove the shampoo.  Use CHG Soap as you would any other liquid soap. You can apply CHG directly to the skin and wash gently with a scrungie or a clean washcloth.   Apply the CHG Soap to your body ONLY FROM THE NECK DOWN.  Do not use on open wounds or open sores. Avoid contact with your eyes, ears, mouth and genitals (private parts). Wash Face and genitals (private parts)  with your normal soap.   Wash thoroughly, paying special attention to the area where your surgery will be performed.  Thoroughly rinse your body with warm water from the neck down.  DO NOT shower/wash with your normal soap after using and rinsing off the CHG Soap.  Pat yourself dry with a CLEAN TOWEL.  Wear CLEAN PAJAMAS to bed the night before surgery  Place CLEAN SHEETS on your bed the night before your surgery  DO NOT SLEEP WITH PETS.   Day of Surgery: Take a shower with CHG soap. Do not wear jewelry or makeup Do not wear lotions, powders, perfumes, or deodorant. Do not shave 48 hours prior to surgery.   Do not bring valuables to the hospital. [facility] is not responsible for any belongings or valuables. Do not wear nail polish, gel polish, artificial nails, or any other type of covering on natural nails (fingers and toes) If you have artificial nails or gel coating that need to be removed by a nail salon, please have this removed prior to surgery. Artificial nails or gel coating may interfere with anesthesia's ability to adequately monitor your vital signs. Wear Clean/Comfortable clothing the morning of surgery Remember to brush your teeth WITH YOUR REGULAR TOOTHPASTE.   Please read over the following fact sheets that you were given.    If you received a COVID test during your pre-op visit  it is requested that you wear a mask when out in public, stay away from anyone that may not  be feeling well and notify your surgeon if you develop symptoms. If you have been in contact with anyone that has tested positive in the last 10 days please notify you surgeon.

## 2022-05-19 ENCOUNTER — Encounter (HOSPITAL_COMMUNITY)
Admission: RE | Admit: 2022-05-19 | Discharge: 2022-05-19 | Disposition: A | Discharge status: Home / Self Care | Payer: Medicaid Other | Source: Ambulatory Visit | Attending: Orthopaedic Surgery | Admitting: Orthopaedic Surgery

## 2022-05-19 ENCOUNTER — Encounter (HOSPITAL_COMMUNITY): Payer: Self-pay

## 2022-05-19 ENCOUNTER — Other Ambulatory Visit: Payer: Self-pay

## 2022-05-19 VITALS — BP 126/76 | HR 85 | Temp 98.3°F | Resp 18 | Ht 69.0 in | Wt 155.0 lb

## 2022-05-19 DIAGNOSIS — Z01818 Encounter for other preprocedural examination: Secondary | ICD-10-CM

## 2022-05-19 DIAGNOSIS — Z01812 Encounter for preprocedural laboratory examination: Secondary | ICD-10-CM | POA: Diagnosis present

## 2022-05-19 LAB — CBC
HCT: 36.8 % (ref 36.0–46.0)
Hemoglobin: 11.9 g/dL — ABNORMAL LOW (ref 12.0–15.0)
MCH: 31.9 pg (ref 26.0–34.0)
MCHC: 32.3 g/dL (ref 30.0–36.0)
MCV: 98.7 fL (ref 80.0–100.0)
Platelets: 301 10*3/uL (ref 150–400)
RBC: 3.73 MIL/uL — ABNORMAL LOW (ref 3.87–5.11)
RDW: 12.8 % (ref 11.5–15.5)
WBC: 4.9 10*3/uL (ref 4.0–10.5)
nRBC: 0 % (ref 0.0–0.2)

## 2022-05-19 NOTE — Pre-Procedure Instructions (Signed)
Surgical Instructions    Your procedure is scheduled on Tuesday, April 2nd.  Report to [facility] Main Entrance "A" at 05:30 A.M., then check in with the Admitting office.  Call this number if you have problems the morning of surgery:  [phone]  If you have any questions prior to your surgery date call [phone]: Open Monday-Friday 8am-4pm If you experience any cold or flu symptoms such as cough, fever, chills, shortness of breath, etc. between now and your scheduled surgery, please notify us at the above number.     Remember:  Do not eat after midnight the night before your surgery  You may drink clear liquids until 04:30 AM the morning of your surgery.   Clear liquids allowed are: Water, Non-Citrus Juices (without pulp), Carbonated Beverages, Clear Tea, Black Coffee Only (NO MILK, CREAM OR POWDERED CREAMER of any kind), and Gatorade.   Patient Instructions  The night before surgery:  No food after midnight. ONLY clear liquids after midnight  The day of surgery (if you do NOT have diabetes):  Drink ONE (1) Pre-Surgery Clear Ensure by 04:30 AM the morning of surgery. Drink in one sitting. Do not sip.  This drink was given to you during your hospital  pre-op appointment visit.  Nothing else to drink after completing the  Pre-Surgery Clear Ensure.          If you have questions, please contact your surgeon's office.     Take these medicines the morning of surgery with A SIP OF WATER: NONE  As of today, STOP taking any Aspirin (unless otherwise instructed by your surgeon) Aleve, Naproxen, Ibuprofen, Motrin, Advil, Goody's, BC's, all herbal medications, fish oil, and all vitamins.                     Do NOT Smoke (Tobacco/Vaping) for 24 hours prior to your procedure.  If you use a CPAP at night, you may bring your mask/headgear for your overnight stay.   Contacts, glasses, piercing's, hearing aid's, dentures or partials may not be worn into surgery, please bring cases  for these belongings.    For patients admitted to the hospital, discharge time will be determined by your treatment team.   Patients discharged the day of surgery will not be allowed to drive home, and someone needs to stay with them for 24 hours.  SURGICAL WAITING ROOM VISITATION Patients having surgery or a procedure may have no more than 2 support people in the waiting area - these visitors may rotate.   Children under the age of 76 must have an adult with them who is not the patient. If the patient needs to stay at the hospital during part of their recovery, the visitor guidelines for inpatient rooms apply. Pre-op nurse will coordinate an appropriate time for 1 support person to accompany patient in pre-op.  This support person may not rotate.   Please refer to the [facility] website for the visitor guidelines for Inpatients (after your surgery is over and you are in a regular room).    Special instructions:   [place]- Preparing For Surgery  Before surgery, you can play an important role. Because skin is not sterile, your skin needs to be as free of germs as possible. You can reduce the number of germs on your skin by washing with CHG (chlorahexidine gluconate) Soap before surgery.  CHG is an antiseptic cleaner which kills germs and bonds with the skin to continue killing germs even after washing.  Oral Hygiene is also important to reduce your risk of infection.  Remember - BRUSH YOUR TEETH THE MORNING OF SURGERY WITH YOUR REGULAR TOOTHPASTE  Please do not use if you have an allergy to CHG or antibacterial soaps. If your skin becomes reddened/irritated stop using the CHG.  Do not shave (including legs and underarms) for at least 48 hours prior to first CHG shower. It is OK to shave your face.  Please follow these instructions carefully.   Shower the NIGHT BEFORE SURGERY and the MORNING OF SURGERY  If you chose to wash your hair, wash your hair first as usual with your normal  shampoo.  After you shampoo, rinse your hair and body thoroughly to remove the shampoo.  Use CHG Soap as you would any other liquid soap. You can apply CHG directly to the skin and wash gently with a scrungie or a clean washcloth.   Apply the CHG Soap to your body ONLY FROM THE NECK DOWN.  Do not use on open wounds or open sores. Avoid contact with your eyes, ears, mouth and genitals (private parts). Wash Face and genitals (private parts)  with your normal soap.   Wash thoroughly, paying special attention to the area where your surgery will be performed.  Thoroughly rinse your body with warm water from the neck down.  DO NOT shower/wash with your normal soap after using and rinsing off the CHG Soap.  Pat yourself dry with a CLEAN TOWEL.  Wear CLEAN PAJAMAS to bed the night before surgery  Place CLEAN SHEETS on your bed the night before your surgery  DO NOT SLEEP WITH PETS.   Day of Surgery: Take a shower with CHG soap. Do not wear jewelry or makeup Do not wear lotions, powders, perfumes, or deodorant. Do not shave 48 hours prior to surgery.   Do not bring valuables to the hospital. [facility] is not responsible for any belongings or valuables. Do not wear nail polish, gel polish, artificial nails, or any other type of covering on natural nails (fingers and toes) If you have artificial nails or gel coating that need to be removed by a nail salon, please have this removed prior to surgery. Artificial nails or gel coating may interfere with anesthesia's ability to adequately monitor your vital signs. Wear Clean/Comfortable clothing the morning of surgery Remember to brush your teeth WITH YOUR REGULAR TOOTHPASTE.   Please read over the following fact sheets that you were given.    If you received a COVID test during your pre-op visit  it is requested that you wear a mask when out in public, stay away from anyone that may not be feeling well and notify your surgeon if you develop  symptoms. If you have been in contact with anyone that has tested positive in the last 10 days please notify you surgeon.

## 2022-05-19 NOTE — Progress Notes (Signed)
PCP - [place] at [facility] Cardiologist - denies  PPM/ICD - denies  Chest x-ray - n/a EKG - n/a Stress Test - denies ECHO - denies Cardiac Cath - denies  Sleep Study - denies  ERAS Protcol -yes PRE-SURGERY Ensure or G2- ordered and given  COVID TEST- not needed   Anesthesia review: no  Patient denies shortness of breath, fever, cough and chest pain at PAT appointment   All instructions explained to the patient, with a verbal understanding of the material. Patient agrees to go over the instructions while at home for a better understanding. Patient also instructed to self quarantine after being tested for COVID-19. The opportunity to ask questions was provided.

## 2022-05-29 ENCOUNTER — Ambulatory Visit (HOSPITAL_COMMUNITY): Admission: RE | Admit: 2022-05-29 | Payer: Medicaid Other | Source: Ambulatory Visit | Admitting: Orthopaedic Surgery

## 2022-05-29 SURGERY — EXOSTOSIS EXCISION/HAGLUND'S DEFORMITY/PUMP BUMP
Anesthesia: General | Site: Toe | Laterality: Right
